# Patient Record
Sex: Female | Born: 1940 | Race: White | Hispanic: No | Marital: Married
Health system: Southern US, Academic
[De-identification: ages and names within clinical notes are randomized; demographics above are authoritative.]

## PROBLEM LIST (undated history)

## (undated) ENCOUNTER — Encounter

## (undated) ENCOUNTER — Telehealth

## (undated) ENCOUNTER — Ambulatory Visit: Payer: MEDICARE

## (undated) ENCOUNTER — Telehealth: Attending: Hematology & Oncology | Primary: Hematology & Oncology

## (undated) ENCOUNTER — Ambulatory Visit: Payer: Medicare (Managed Care)

## (undated) ENCOUNTER — Encounter: Attending: Hematology & Oncology | Primary: Hematology & Oncology

## (undated) ENCOUNTER — Encounter
Attending: Student in an Organized Health Care Education/Training Program | Primary: Student in an Organized Health Care Education/Training Program

## (undated) ENCOUNTER — Encounter: Attending: Gerontology | Primary: Gerontology

## (undated) ENCOUNTER — Encounter: Attending: Ambulatory Care | Primary: Ambulatory Care

## (undated) ENCOUNTER — Ambulatory Visit: Payer: Medicare (Managed Care) | Attending: Medical | Primary: Medical

## (undated) ENCOUNTER — Encounter: Payer: MEDICARE | Attending: Dermatology | Primary: Dermatology

## (undated) ENCOUNTER — Non-Acute Institutional Stay: Payer: MEDICARE

## (undated) ENCOUNTER — Ambulatory Visit

## (undated) ENCOUNTER — Telehealth: Attending: Ambulatory Care | Primary: Ambulatory Care

## (undated) ENCOUNTER — Ambulatory Visit: Attending: Hematology & Oncology | Primary: Hematology & Oncology

## (undated) ENCOUNTER — Encounter
Payer: MEDICARE | Attending: Student in an Organized Health Care Education/Training Program | Primary: Student in an Organized Health Care Education/Training Program

## (undated) DIAGNOSIS — I219 Acute myocardial infarction, unspecified: Secondary | ICD-10-CM

## (undated) DIAGNOSIS — I10 Essential (primary) hypertension: Secondary | ICD-10-CM

## (undated) DIAGNOSIS — M359 Systemic involvement of connective tissue, unspecified: Secondary | ICD-10-CM

## (undated) DIAGNOSIS — C801 Malignant (primary) neoplasm, unspecified: Secondary | ICD-10-CM

## (undated) HISTORY — DX: Acute myocardial infarction, unspecified: I21.9

## (undated) HISTORY — DX: Essential (primary) hypertension: I10

## (undated) HISTORY — PX: ABDOMINAL HYSTERECTOMY: SHX81

## (undated) HISTORY — PX: CARDIAC SURGERY: SHX584

## (undated) MED ORDER — AMLODIPINE 5 MG TABLET: 5 mg | tablet | Freq: Every day | 3 refills | 90 days | Status: CN

---
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
## 2009-02-11 ENCOUNTER — Ambulatory Visit: Payer: Self-pay | Admitting: Gastroenterology

## 2011-04-08 ENCOUNTER — Ambulatory Visit: Payer: Self-pay | Admitting: Family Medicine

## 2012-04-27 ENCOUNTER — Ambulatory Visit: Payer: Self-pay | Admitting: Family Medicine

## 2015-01-17 ENCOUNTER — Encounter (INDEPENDENT_AMBULATORY_CARE_PROVIDER_SITE_OTHER): Payer: Medicare Other | Admitting: Ophthalmology

## 2015-01-17 DIAGNOSIS — H35033 Hypertensive retinopathy, bilateral: Secondary | ICD-10-CM | POA: Diagnosis not present

## 2015-01-17 DIAGNOSIS — H3531 Nonexudative age-related macular degeneration: Secondary | ICD-10-CM

## 2015-01-17 DIAGNOSIS — H43813 Vitreous degeneration, bilateral: Secondary | ICD-10-CM | POA: Diagnosis not present

## 2015-01-17 DIAGNOSIS — I10 Essential (primary) hypertension: Secondary | ICD-10-CM

## 2015-01-17 DIAGNOSIS — M0609 Rheumatoid arthritis without rheumatoid factor, multiple sites: Secondary | ICD-10-CM

## 2015-07-23 ENCOUNTER — Ambulatory Visit (INDEPENDENT_AMBULATORY_CARE_PROVIDER_SITE_OTHER): Payer: Medicare Other | Admitting: Ophthalmology

## 2015-08-09 ENCOUNTER — Ambulatory Visit (INDEPENDENT_AMBULATORY_CARE_PROVIDER_SITE_OTHER): Payer: Medicare Other | Admitting: Ophthalmology

## 2015-08-09 DIAGNOSIS — H353132 Nonexudative age-related macular degeneration, bilateral, intermediate dry stage: Secondary | ICD-10-CM | POA: Diagnosis not present

## 2015-08-09 DIAGNOSIS — H35033 Hypertensive retinopathy, bilateral: Secondary | ICD-10-CM | POA: Diagnosis not present

## 2015-08-09 DIAGNOSIS — I10 Essential (primary) hypertension: Secondary | ICD-10-CM

## 2015-08-09 DIAGNOSIS — H43813 Vitreous degeneration, bilateral: Secondary | ICD-10-CM | POA: Diagnosis not present

## 2016-02-07 ENCOUNTER — Ambulatory Visit (INDEPENDENT_AMBULATORY_CARE_PROVIDER_SITE_OTHER): Payer: Medicare Other | Admitting: Ophthalmology

## 2016-02-07 DIAGNOSIS — Z79899 Other long term (current) drug therapy: Secondary | ICD-10-CM | POA: Diagnosis not present

## 2016-02-07 DIAGNOSIS — H35033 Hypertensive retinopathy, bilateral: Secondary | ICD-10-CM

## 2016-02-07 DIAGNOSIS — M069 Rheumatoid arthritis, unspecified: Secondary | ICD-10-CM

## 2016-02-07 DIAGNOSIS — H43813 Vitreous degeneration, bilateral: Secondary | ICD-10-CM

## 2016-02-07 DIAGNOSIS — I10 Essential (primary) hypertension: Secondary | ICD-10-CM

## 2016-02-07 DIAGNOSIS — H353132 Nonexudative age-related macular degeneration, bilateral, intermediate dry stage: Secondary | ICD-10-CM | POA: Diagnosis not present

## 2016-08-06 ENCOUNTER — Encounter (INDEPENDENT_AMBULATORY_CARE_PROVIDER_SITE_OTHER): Payer: Self-pay

## 2016-08-06 ENCOUNTER — Ambulatory Visit (INDEPENDENT_AMBULATORY_CARE_PROVIDER_SITE_OTHER): Payer: Medicare Other | Admitting: Vascular Surgery

## 2016-08-06 ENCOUNTER — Encounter (INDEPENDENT_AMBULATORY_CARE_PROVIDER_SITE_OTHER): Payer: Self-pay | Admitting: Vascular Surgery

## 2016-08-06 VITALS — BP 174/90 | HR 77 | Resp 16 | Ht 64.0 in | Wt 195.0 lb

## 2016-08-06 DIAGNOSIS — I10 Essential (primary) hypertension: Secondary | ICD-10-CM

## 2016-08-06 DIAGNOSIS — M7989 Other specified soft tissue disorders: Secondary | ICD-10-CM

## 2016-08-06 DIAGNOSIS — M79604 Pain in right leg: Secondary | ICD-10-CM

## 2016-08-06 DIAGNOSIS — M79605 Pain in left leg: Secondary | ICD-10-CM | POA: Diagnosis not present

## 2016-08-10 ENCOUNTER — Ambulatory Visit (INDEPENDENT_AMBULATORY_CARE_PROVIDER_SITE_OTHER): Payer: Medicare Other | Admitting: Ophthalmology

## 2016-08-10 DIAGNOSIS — H35033 Hypertensive retinopathy, bilateral: Secondary | ICD-10-CM | POA: Diagnosis not present

## 2016-08-10 DIAGNOSIS — M79605 Pain in left leg: Secondary | ICD-10-CM

## 2016-08-10 DIAGNOSIS — H43813 Vitreous degeneration, bilateral: Secondary | ICD-10-CM | POA: Diagnosis not present

## 2016-08-10 DIAGNOSIS — H353132 Nonexudative age-related macular degeneration, bilateral, intermediate dry stage: Secondary | ICD-10-CM

## 2016-08-10 DIAGNOSIS — I10 Essential (primary) hypertension: Secondary | ICD-10-CM

## 2016-08-10 DIAGNOSIS — H3554 Dystrophies primarily involving the retinal pigment epithelium: Secondary | ICD-10-CM | POA: Diagnosis not present

## 2016-08-10 DIAGNOSIS — M79604 Pain in right leg: Secondary | ICD-10-CM | POA: Insufficient documentation

## 2016-08-10 DIAGNOSIS — M7989 Other specified soft tissue disorders: Secondary | ICD-10-CM | POA: Insufficient documentation

## 2016-08-10 NOTE — Progress Notes (Signed)
Subjective:    Patient ID: Sally Lee, female    DOB: March 06, 1941, 75 y.o.   MRN: [id number] Chief Complaint  Patient presents with  . New Patient (Initial Visit)   Presents as a new patient referred by [facility], Dr. Ubaldo Glassing for bilateral lower extremity swelling and pain. Patient endorses a history of minimal amount of bilateral lower extremity swelling for "years". This exacerbation has been the "worst". States her legs are "constantly" swollen. Her diuretics have been increased without any improvement. Swelling has been present for about one month. She has worn compression stockings in the past however states her legs are "too swollen" to wear them now. She has noticed her legs have becomes painful since they have "gotten so swollen". She does not elevate her legs. Denies history of ulceration to the lower extremity.    Review of Systems  Constitutional: Negative.   HENT: Negative.   Eyes: Negative.   Respiratory: Negative.   Cardiovascular: Positive for leg swelling.  Gastrointestinal: Negative.   Endocrine: Negative.   Genitourinary: Negative.   Musculoskeletal: Negative.   Skin: Negative.   Allergic/Immunologic: Negative.   Neurological: Negative.   Hematological: Negative.   Psychiatric/Behavioral: Negative.        Objective:   Physical Exam  Constitutional: She is oriented to person, place, and time. She appears well-developed and well-nourished.  HENT:  Head: Normocephalic and atraumatic.  Right Ear: External ear normal.  Left Ear: External ear normal.  Eyes: Conjunctivae and EOM are normal. Pupils are equal, round, and reactive to light.  Neck: Normal range of motion.  Cardiovascular: Normal rate, regular rhythm, normal heart sounds and intact distal pulses.   Pulses:      Radial pulses are 2+ on the right side, and 2+ on the left side.       Dorsalis pedis pulses are 2+ on the right side, and 2+ on the left side.       Posterior tibial pulses are 2+  on the right side, and 2+ on the left side.  Pulmonary/Chest: Effort normal and breath sounds normal.  Abdominal: Soft. Bowel sounds are normal.  Musculoskeletal: Normal range of motion. She exhibits edema (Moderate 2+ Pitting Edema).  Neurological: She is alert and oriented to person, place, and time.  Skin: Skin is warm and dry.  Psychiatric: She has a normal mood and affect. Her behavior is normal. Judgment and thought content normal.   BP (!) 174/90   Pulse 77   Resp 16   Ht 5\' 4"  (1.626 m)   Wt 195 lb (88.5 kg)   BMI 33.47 kg/m   Past Medical History:  Diagnosis Date  . Hypertension   . Myocardial infarction    Social History   Social History  . Marital status: Married    Spouse name: N/A  . Number of children: N/A  . Years of education: N/A   Occupational History  . Not on file.   Social History Main Topics  . Smoking status: Former Research scientist (life sciences)  . Smokeless tobacco: Never Used  . Alcohol use No  . Drug use: No  . Sexual activity: Not on file   Other Topics Concern  . Not on file   Social History Narrative  . No narrative on file   Past Surgical History:  Procedure Laterality Date  . ABDOMINAL HYSTERECTOMY    . CARDIAC SURGERY     stent placement   Family History  Problem Relation Age of Onset  . Cancer Mother   .  Congestive Heart Failure Father   . Diabetes Maternal Grandmother    Allergies  Allergen Reactions  . Aspirin Swelling      Assessment & Plan:  Presents as a new patient referred by [facility], Dr. Ubaldo Glassing for bilateral lower extremity swelling and pain. Patient endorses a history of minimal amount of bilateral lower extremity swelling for "years". This exacerbation has been the "worst". States her legs are "constantly" swollen. Her diuretics have been increased without any improvement. Swelling has been present for about one month. She has worn compression stockings in the past however states her legs are "too swollen" to wear them now. She  has noticed her legs have becomes painful since they have "gotten so swollen". She does not elevate her legs. Denies history of ulceration to the lower extremity.   1. Swelling of lower extremity - New Will apply bilateral unna boot x four weeks to gain control of swelling. Patient will engage in appropriate elevation Will follow up in one month to assess swelling Will order venous duplex to rule our reflux vs lymphedema  2. Pain in both lower extremities - New Most likely due to swelling  Will apply bilateral unna boot x four weeks to gain control of swelling. Patient will engage in appropriate elevation Will follow up in one month to assess swelling Will order venous duplex to rule our reflux vs lymphedema  3. Essential hypertension - Stable Encouraged good control as its slows the progression of atherosclerotic disease  No current outpatient prescriptions on file prior to visit.   No current facility-administered medications on file prior to visit.     There are no Patient Instructions on file for this visit. Return in about 1 month (around 09/06/2016) for Swelling Follow Up.   Giovoni Bunch A Jet Traynham, PA-C

## 2016-08-11 ENCOUNTER — Encounter (INDEPENDENT_AMBULATORY_CARE_PROVIDER_SITE_OTHER): Payer: Medicare Other | Admitting: Vascular Surgery

## 2016-08-13 ENCOUNTER — Ambulatory Visit (INDEPENDENT_AMBULATORY_CARE_PROVIDER_SITE_OTHER): Payer: Medicare Other

## 2016-08-13 ENCOUNTER — Encounter (INDEPENDENT_AMBULATORY_CARE_PROVIDER_SITE_OTHER): Payer: Self-pay | Admitting: Vascular Surgery

## 2016-08-13 ENCOUNTER — Encounter (INDEPENDENT_AMBULATORY_CARE_PROVIDER_SITE_OTHER): Payer: Medicare Other

## 2016-08-13 ENCOUNTER — Ambulatory Visit (INDEPENDENT_AMBULATORY_CARE_PROVIDER_SITE_OTHER): Payer: Medicare Other | Admitting: Vascular Surgery

## 2016-08-13 VITALS — BP 184/98 | HR 73 | Resp 16

## 2016-08-13 DIAGNOSIS — M79605 Pain in left leg: Secondary | ICD-10-CM

## 2016-08-13 DIAGNOSIS — I872 Venous insufficiency (chronic) (peripheral): Secondary | ICD-10-CM

## 2016-08-13 DIAGNOSIS — I890 Lymphedema, not elsewhere classified: Secondary | ICD-10-CM

## 2016-08-13 DIAGNOSIS — M79604 Pain in right leg: Secondary | ICD-10-CM

## 2016-08-13 DIAGNOSIS — M7989 Other specified soft tissue disorders: Secondary | ICD-10-CM

## 2016-08-13 NOTE — Progress Notes (Signed)
MRN : [id number]  Sally Lee is a 75 y.o. (1940/12/04) female who presents with chief complaint of  Chief Complaint  Patient presents with  . Follow-up  .  History of Present Illness: The patient returns to the office for followup evaluation regarding leg swelling.  The swelling has persisted and the pain associated with swelling continues. There have not been any interval development of a ulcerations or wounds.  Since the previous visit the patient has been wearing graduated compression stockings and has noted little if any improvement in the lymphedema. The patient has been using compression routinely morning until night.  The patient also states elevation during the day and exercise is being done too.   Current Meds  Medication Sig  . cholecalciferol (VITAMIN D) 1000 units tablet Take 1,000 Units by mouth daily.  . clopidogrel (PLAVIX) 75 MG tablet Take 75 mg by mouth daily.  . hydrochlorothiazide (HYDRODIURIL) 25 MG tablet   . hydroxychloroquine (PLAQUENIL) 200 MG tablet Take by mouth.  . lansoprazole (PREVACID) 30 MG capsule Take 30 mg by mouth daily.  . Multiple Vitamins-Minerals (PRESERVISION AREDS 2 PO) Take by mouth daily.  Marland Kitchen sulfaSALAzine (AZULFIDINE) 500 MG tablet   . torsemide (DEMADEX) 20 MG tablet     Past Medical History:  Diagnosis Date  . Hypertension   . Myocardial infarction     Past Surgical History:  Procedure Laterality Date  . ABDOMINAL HYSTERECTOMY    . CARDIAC SURGERY     stent placement    Social History Social History  Substance Use Topics  . Smoking status: Former Research scientist (life sciences)  . Smokeless tobacco: Never Used  . Alcohol use No    Family History Family History  Problem Relation Age of Onset  . Cancer Mother   . Congestive Heart Failure Father   . Diabetes Maternal Grandmother   No family history of bleeding/clotting disorders, porphyria or autoimmune disease   Allergies  Allergen Reactions  . Aspirin Swelling      REVIEW OF SYSTEMS (Negative unless checked)  Constitutional: [] Weight loss  [] Fever  [] Chills Cardiac: [] Chest pain   [] Chest pressure   [] Palpitations   [] Shortness of breath when laying flat   [] Shortness of breath with exertion. Vascular:  [] Pain in legs with walking   [x] Pain in legs with standing  [] History of DVT   [] Phlebitis   [x] Swelling in legs   [] Varicose veins   [] Non-healing ulcers Pulmonary:   [] Uses home oxygen   [] Productive cough   [] Hemoptysis   [] Wheeze  [] COPD   [] Asthma Neurologic:  [] Dizziness   [] Seizures   [] History of stroke   [] History of TIA  [] Aphasia   [] Vissual changes   [] Weakness or numbness in arm   [] Weakness or numbness in leg Musculoskeletal:   [] Joint swelling   [x] Joint pain   [] Low back pain Hematologic:  [] Easy bruising  [] Easy bleeding   [] Hypercoagulable state   [] Anemic Gastrointestinal:  [] Diarrhea   [] Vomiting  [] Gastroesophageal reflux/heartburn   [] Difficulty swallowing. Genitourinary:  [] Chronic kidney disease   [] Difficult urination  [] Frequent urination   [] Blood in urine Skin:  [] Rashes   [] Ulcers  Psychological:  [] History of anxiety   []  History of major depression.  Physical Examination  Vitals:   08/13/16 1447  BP: (!) 184/98  Pulse: 73  Resp: 16   There is no height or weight on file to calculate BMI. Gen: WD/WN, NAD Head: [place]/AT, No temporalis wasting.  Ear/Nose/Throat: Hearing grossly intact, nares w/o erythema or  drainage, poor dentition Eyes: PER, EOMI, sclera nonicteric.  Neck: Supple, no masses.  No bruit or JVD.  Pulmonary:  Good air movement, clear to auscultation bilaterally, no use of accessory muscles.  Cardiac: RRR, normal S1, S2, no Murmurs. Vascular: 3+ pitting edema bilaterally with moderate skin changes, no ulcers Vessel Right Left  Radial Palpable Palpable  Ulnar Palpable Palpable  Brachial Palpable Palpable  Carotid Palpable Palpable  Femoral Palpable Palpable  Popliteal Palpable Palpable  PT  Palpable Palpable  DP Palpable Palpable   Gastrointestinal: soft, non-distended. No guarding/no peritoneal signs.  Musculoskeletal: M/S 5/5 throughout.  No deformity or atrophy.  Neurologic: CN 2-12 intact. Pain and light touch intact in extremities.  Symmetrical.  Speech is fluent. Motor exam as listed above. Psychiatric: Judgment intact, Mood & affect appropriate for pt's clinical situation. Dermatologic: No rashes or ulcers noted.  No changes consistent with cellulitis. Lymph : No Cervical lymphadenopathy, no lichenification or skin changes of chronic lymphedema.  CBC No results found for: WBC, HGB, HCT, MCV, PLT  BMET No results found for: NA, K, CL, CO2, GLUCOSE, BUN, CREATININE, CALCIUM, GFRNONAA, GFRAA CrCl cannot be calculated (No order found.).  COAG No results found for: INR, PROTIME  Radiology No results found.  Assessment/Plan 1. Lymphedema Recommend:  No surgery or intervention at this point in time.    I have reviewed my previous discussion with the patient regarding swelling and why it causes symptoms.  Patient will continue wearing graduated compression stockings class 1 (20-30 mmHg) on a daily basis. The patient will  beginning wearing the stockings first thing in the morning and removing them in the evening. The patient is instructed specifically not to sleep in the stockings.    In addition, behavioral modification including several periods of elevation of the lower extremities during the day will be continued.  This was reviewed with the patient during the initial visit.  The patient will also continue routine exercise, especially walking on a daily basis as was discussed during the initial visit.    Since graduated compression therapy and behavioral modification has not yielded adequate control of the lymphedema I believe that a lymph pump should be added to improve the control of the patient's lymphedema.  Additionally, a lymph pump is warranted because it  will reduce the risk of cellulitis and ulceration in the future.  Patient should follow-up in six months   2. Chronic venous insufficiency See #1  3. Swelling of lower extremity See #1  4. Pain in both lower extremities See #1    Hortencia Pilar, MD  08/13/2016 5:47 PM

## 2016-08-20 ENCOUNTER — Encounter (INDEPENDENT_AMBULATORY_CARE_PROVIDER_SITE_OTHER): Payer: Medicare Other

## 2016-08-27 ENCOUNTER — Encounter (INDEPENDENT_AMBULATORY_CARE_PROVIDER_SITE_OTHER): Payer: Medicare Other

## 2016-08-27 ENCOUNTER — Encounter (INDEPENDENT_AMBULATORY_CARE_PROVIDER_SITE_OTHER): Payer: Self-pay

## 2016-09-03 ENCOUNTER — Encounter (INDEPENDENT_AMBULATORY_CARE_PROVIDER_SITE_OTHER): Payer: Medicare Other

## 2016-09-03 ENCOUNTER — Ambulatory Visit (INDEPENDENT_AMBULATORY_CARE_PROVIDER_SITE_OTHER): Payer: Medicare Other | Admitting: Vascular Surgery

## 2016-10-05 ENCOUNTER — Telehealth (INDEPENDENT_AMBULATORY_CARE_PROVIDER_SITE_OTHER): Payer: Self-pay | Admitting: Vascular Surgery

## 2016-10-05 NOTE — Telephone Encounter (Signed)
Pt stated [facility] has put in an order for lymph pump, but she is doing fine and wanted to know does she still need to pursue getting the pump if she don't want to. Please call pt at [phone]

## 2017-01-19 ENCOUNTER — Emergency Department: Payer: Medicare Other

## 2017-01-19 ENCOUNTER — Emergency Department
Admission: EM | Admit: 2017-01-19 | Discharge: 2017-01-19 | Disposition: A | Discharge status: Home / Self Care | Payer: Medicare Other | Attending: Emergency Medicine | Admitting: Emergency Medicine

## 2017-01-19 ENCOUNTER — Encounter: Payer: Self-pay | Admitting: Emergency Medicine

## 2017-01-19 DIAGNOSIS — Z87891 Personal history of nicotine dependence: Secondary | ICD-10-CM | POA: Insufficient documentation

## 2017-01-19 DIAGNOSIS — R05 Cough: Secondary | ICD-10-CM | POA: Diagnosis not present

## 2017-01-19 DIAGNOSIS — Z79899 Other long term (current) drug therapy: Secondary | ICD-10-CM | POA: Diagnosis not present

## 2017-01-19 DIAGNOSIS — R142 Eructation: Secondary | ICD-10-CM | POA: Diagnosis not present

## 2017-01-19 DIAGNOSIS — R079 Chest pain, unspecified: Secondary | ICD-10-CM | POA: Diagnosis present

## 2017-01-19 DIAGNOSIS — I10 Essential (primary) hypertension: Secondary | ICD-10-CM | POA: Diagnosis not present

## 2017-01-19 DIAGNOSIS — R0781 Pleurodynia: Secondary | ICD-10-CM | POA: Diagnosis not present

## 2017-01-19 HISTORY — DX: Systemic involvement of connective tissue, unspecified: M35.9

## 2017-01-19 LAB — BASIC METABOLIC PANEL
ANION GAP: 8 (ref 5–15)
BUN: 26 mg/dL — ABNORMAL HIGH (ref 6–20)
CALCIUM: 9.2 mg/dL (ref 8.9–10.3)
CO2: 25 mmol/L (ref 22–32)
Chloride: 108 mmol/L (ref 101–111)
Creatinine, Ser: 1.02 mg/dL — ABNORMAL HIGH (ref 0.44–1.00)
GFR calc Af Amer: >60 mL/min (ref >60)
GFR, EST NON AFRICAN AMERICAN: 52 mL/min — AB (ref >60)
GLUCOSE: 106 mg/dL — AB (ref 65–99)
POTASSIUM: 4.1 mmol/L (ref 3.5–5.1)
Sodium: 141 mmol/L (ref 135–145)

## 2017-01-19 LAB — CBC
HEMATOCRIT: 44.4 % (ref 35.0–47.0)
HEMOGLOBIN: 14.8 g/dL (ref 12.0–16.0)
MCH: 30.1 pg (ref 26.0–34.0)
MCHC: 33.3 g/dL (ref 32.0–36.0)
MCV: 90.4 fL (ref 80.0–100.0)
Platelets: 214 10*3/uL (ref 150–440)
RBC: 4.91 MIL/uL (ref 3.80–5.20)
RDW: 13.8 % (ref 11.5–14.5)
WBC: 14.4 10*3/uL — ABNORMAL HIGH (ref 3.6–11.0)

## 2017-01-19 LAB — TROPONIN I

## 2017-01-19 LAB — FIBRIN DERIVATIVES D-DIMER (ARMC ONLY): Fibrin derivatives D-dimer (ARMC): 846.8 — ABNORMAL HIGH (ref 0.00–499.00)

## 2017-01-19 MED ORDER — KETOROLAC TROMETHAMINE 30 MG/ML IJ SOLN
INTRAMUSCULAR | Status: AC
  Administered 2017-01-19: 15 mg via INTRAVENOUS
  Filled 2017-01-19: qty 1

## 2017-01-19 MED ORDER — PREDNISONE 10 MG (21) PO TBPK: ORAL_TABLET | Freq: Every day | ORAL | 0 refills | Status: DC

## 2017-01-19 MED ORDER — TRAMADOL HCL 50 MG PO TABS: 50.0000 mg | ORAL_TABLET | Freq: Four times a day (QID) | ORAL | 0 refills | Status: AC | PRN

## 2017-01-19 MED ORDER — IOPAMIDOL (ISOVUE-370) INJECTION 76%
75.0000 mL | Freq: Once | INTRAVENOUS | Status: AC | PRN
  Administered 2017-01-19: 75 mL via INTRAVENOUS

## 2017-01-19 MED ORDER — KETOROLAC TROMETHAMINE 30 MG/ML IJ SOLN
15.0000 mg | Freq: Once | INTRAMUSCULAR | Status: AC
  Administered 2017-01-19: 15 mg via INTRAVENOUS

## 2017-01-19 NOTE — ED Provider Notes (Signed)
[facility] Emergency Department Provider Note       Time seen: ----------------------------------------- 7:03 AM on 01/19/2017 -----------------------------------------     I have reviewed the triage vital signs and the nursing notes.   HISTORY   Chief Complaint Chest Pain    HPI Sally Lee is a 76 y.o. female who presents to the ED for mid chest pain that is nonradiating. Pain is been accompanied by burping since awakening at 2 AM. Patient states she has history of same with a diagnosis of pericarditis. Current pain is 6 out of 10, and she also had left-sided chest pain that hurts when she bruits. Patient reports she was at [facility] and has finished colchicine for treatments of pericarditis. Pain is not as severe as it was then. She denies fevers, chills or other complaints. She has had a mild cough.   Past Medical History:  Diagnosis Date  . Hypertension   . Myocardial infarction [facility])     Patient Active Problem List   Diagnosis Date Noted  . Lymphedema 08/13/2016  . Chronic venous insufficiency 08/13/2016  . Swelling of lower extremity 08/10/2016  . Pain in both lower extremities 08/10/2016  . Essential hypertension 08/10/2016    Past Surgical History:  Procedure Laterality Date  . ABDOMINAL HYSTERECTOMY    . CARDIAC SURGERY     stent placement    Allergies Aspirin  Social History Social History  Substance Use Topics  . Smoking status: Former Research scientist (life sciences)  . Smokeless tobacco: Never Used  . Alcohol use No    Review of Systems Constitutional: Negative for fever. Eyes: Negative for vision changes ENT:  Negative for congestion, sore throat Cardiovascular: Positive for chest pain Respiratory: Positive for pain with breathing, cough Gastrointestinal: Negative for abdominal pain, vomiting and diarrhea. Positive for belching Genitourinary: Negative for dysuria. Musculoskeletal: Negative for back pain. Skin: Negative for  rash. Neurological: Negative for headaches, focal weakness or numbness.  All systems negative/normal/unremarkable except as stated in the HPI  ____________________________________________   PHYSICAL EXAM:  VITAL SIGNS: ED Triage Vitals  Enc Vitals Group     BP 01/19/17 0354 (!) 153/98     Pulse Rate 01/19/17 0354 86     Resp 01/19/17 0354 18     Temp 01/19/17 0354 98 F (36.7 C)     Temp Source 01/19/17 0354 Oral     SpO2 01/19/17 0354 100 %     Weight 01/19/17 0352 178 lb (80.7 kg)     Height 01/19/17 0352 5\' 4"  (1.626 m)     Head Circumference --      Peak Flow --      Pain Score 01/19/17 0352 6     Pain Loc --      Pain Edu? --      Excl. in [place]? --     Constitutional: Alert and oriented. Well appearing and in no distress. Eyes: Conjunctivae are normal. PERRL. Normal extraocular movements. ENT   Head: Normocephalic and atraumatic.   Nose: No congestion/rhinnorhea.   Mouth/Throat: Mucous membranes are moist.   Neck: No stridor. Cardiovascular: Normal rate, regular rhythm. No murmurs, rubs, or gallops. Respiratory: Normal respiratory effort without tachypnea nor retractions. Breath sounds are clear and equal bilaterally. No wheezes/rales/rhonchi. Gastrointestinal: Soft and nontender. Normal bowel sounds Musculoskeletal: Nontender with normal range of motion in extremities. No lower extremity tenderness nor edema. Neurologic:  Normal speech and language. No gross focal neurologic deficits are appreciated.  Skin:  Skin is warm, dry and  intact. No rash noted. Psychiatric: Mood and affect are normal. Speech and behavior are normal.  ____________________________________________  EKG: Interpreted by me. Sinus rhythm rate 88 bpm, normal PR interval, normal QT. LVH  ____________________________________________  ED COURSE:  Pertinent labs & imaging results that were available during my care of the patient were reviewed by me and considered in my medical decision  making (see chart for details). Patient presents for chest pain, we will assess with labs and imaging as indicated.   Procedures ____________________________________________   LABS (pertinent positives/negatives)  Labs Reviewed  BASIC METABOLIC PANEL - Abnormal; Notable for the following:       Result Value   Glucose, Bld 106 (*)    BUN 26 (*)    Creatinine, Ser 1.02 (*)    GFR calc non Af Amer 52 (*)    All other components within normal limits  CBC - Abnormal; Notable for the following:    WBC 14.4 (*)    All other components within normal limits  FIBRIN DERIVATIVES D-DIMER ([facility] ONLY) - Abnormal; Notable for the following:    Fibrin derivatives D-dimer ([facility]) 846.80 (*)    All other components within normal limits  TROPONIN I  TROPONIN I    RADIOLOGY Images were viewed by me CXR IMPRESSION: Cardiac enlargement. Fluid or thickened pleura in the left costophrenic angle. Linear atelectasis or fibrosis in both lower lungs and right mid lung. IMPRESSION: No evidence pulmonary embolism.  No evidence of acute cardiopulmonary disease.  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 ____________________________________________  FINAL ASSESSMENT AND PLAN  Chest pain, Pleurisy  Plan: Patient's labs and imaging were dictated above. Patient had presented for Chest pain, x-rays revealed fluid in the left costophrenic angle but this was not evidence on CT angiogram. This is likely pleurisy possibly from rheumatoid arthritis. We will try a short course of steroids but I do not feel antibiotics would be beneficial. She is stable for outpatient follow-up.   Earleen Newport, MD   Note: This note was generated in part or whole with voice recognition software. Voice recognition is usually quite accurate but there are transcription errors that can and very often do occur. I apologize for any typographical errors that were not detected and corrected.      Earleen Newport, MD 01/19/17 [phone]

## 2017-01-19 NOTE — ED Triage Notes (Signed)
Pt to triage via w/c with no distress noted; pt c/o mid CP, nonradiating accomp by burping since awakening at 2am; st hx of same with dx pericarditis

## 2017-02-09 ENCOUNTER — Ambulatory Visit (INDEPENDENT_AMBULATORY_CARE_PROVIDER_SITE_OTHER): Payer: Medicare Other | Admitting: Ophthalmology

## 2017-02-23 ENCOUNTER — Ambulatory Visit (INDEPENDENT_AMBULATORY_CARE_PROVIDER_SITE_OTHER): Payer: Medicare Other | Admitting: Ophthalmology

## 2017-02-23 DIAGNOSIS — I10 Essential (primary) hypertension: Secondary | ICD-10-CM

## 2017-02-23 DIAGNOSIS — Z79899 Other long term (current) drug therapy: Secondary | ICD-10-CM | POA: Diagnosis not present

## 2017-02-23 DIAGNOSIS — H353132 Nonexudative age-related macular degeneration, bilateral, intermediate dry stage: Secondary | ICD-10-CM

## 2017-02-23 DIAGNOSIS — H35033 Hypertensive retinopathy, bilateral: Secondary | ICD-10-CM

## 2017-02-23 DIAGNOSIS — H43813 Vitreous degeneration, bilateral: Secondary | ICD-10-CM

## 2017-02-23 DIAGNOSIS — M069 Rheumatoid arthritis, unspecified: Secondary | ICD-10-CM | POA: Diagnosis not present

## 2017-02-25 ENCOUNTER — Ambulatory Visit (INDEPENDENT_AMBULATORY_CARE_PROVIDER_SITE_OTHER): Payer: Medicare Other | Admitting: Vascular Surgery

## 2017-04-06 ENCOUNTER — Encounter (INDEPENDENT_AMBULATORY_CARE_PROVIDER_SITE_OTHER): Payer: Medicare Other | Admitting: Ophthalmology

## 2017-04-06 DIAGNOSIS — H35033 Hypertensive retinopathy, bilateral: Secondary | ICD-10-CM | POA: Diagnosis not present

## 2017-04-06 DIAGNOSIS — I10 Essential (primary) hypertension: Secondary | ICD-10-CM | POA: Diagnosis not present

## 2017-04-06 DIAGNOSIS — H43813 Vitreous degeneration, bilateral: Secondary | ICD-10-CM

## 2017-04-06 DIAGNOSIS — H353132 Nonexudative age-related macular degeneration, bilateral, intermediate dry stage: Secondary | ICD-10-CM | POA: Diagnosis not present

## 2017-05-04 MED ORDER — PREDNISONE 5 MG TABLET
ORAL_TABLET | 0 refills | 0 days | Status: CP
Start: 2017-05-04 — End: 2017-06-10

## 2017-06-03 ENCOUNTER — Ambulatory Visit
Admission: RE | Admit: 2017-06-03 | Discharge: 2017-06-03 | Disposition: A | Discharge status: Home / Self Care | Payer: MEDICARE

## 2017-06-03 DIAGNOSIS — M0600 Rheumatoid arthritis without rheumatoid factor, unspecified site: Principal | ICD-10-CM

## 2017-06-09 ENCOUNTER — Ambulatory Visit
Admission: RE | Admit: 2017-06-09 | Discharge: 2017-07-08 | Payer: MEDICARE | Attending: Rehabilitative and Restorative Service Providers" | Admitting: Rehabilitative and Restorative Service Providers"

## 2017-06-09 DIAGNOSIS — M542 Cervicalgia: Secondary | ICD-10-CM

## 2017-06-10 ENCOUNTER — Ambulatory Visit
Admission: RE | Admit: 2017-06-10 | Discharge: 2017-06-10 | Disposition: A | Discharge status: Home / Self Care | Payer: MEDICARE

## 2017-06-10 DIAGNOSIS — M0600 Rheumatoid arthritis without rheumatoid factor, unspecified site: Principal | ICD-10-CM

## 2017-06-10 DIAGNOSIS — M542 Cervicalgia: Secondary | ICD-10-CM

## 2017-08-27 ENCOUNTER — Ambulatory Visit: Admission: RE | Admit: 2017-08-27 | Discharge: 2017-08-27 | Disposition: A | Discharge status: Home / Self Care

## 2017-08-27 DIAGNOSIS — Z1239 Encounter for other screening for malignant neoplasm of breast: Principal | ICD-10-CM

## 2017-10-07 ENCOUNTER — Encounter (INDEPENDENT_AMBULATORY_CARE_PROVIDER_SITE_OTHER): Payer: Medicare Other | Admitting: Ophthalmology

## 2017-10-07 DIAGNOSIS — H43813 Vitreous degeneration, bilateral: Secondary | ICD-10-CM | POA: Diagnosis not present

## 2017-10-07 DIAGNOSIS — H353132 Nonexudative age-related macular degeneration, bilateral, intermediate dry stage: Secondary | ICD-10-CM

## 2017-10-07 DIAGNOSIS — I10 Essential (primary) hypertension: Secondary | ICD-10-CM | POA: Diagnosis not present

## 2017-10-07 DIAGNOSIS — H35033 Hypertensive retinopathy, bilateral: Secondary | ICD-10-CM

## 2017-10-18 ENCOUNTER — Encounter
Admit: 2017-10-18 | Discharge: 2017-10-19 | Payer: MEDICARE | Attending: Hematology & Oncology | Primary: Hematology & Oncology

## 2017-10-18 DIAGNOSIS — D472 Monoclonal gammopathy: Principal | ICD-10-CM

## 2017-10-19 ENCOUNTER — Encounter: Admit: 2017-10-19 | Discharge: 2017-10-20 | Payer: MEDICARE

## 2017-10-20 ENCOUNTER — Encounter: Admit: 2017-10-20 | Discharge: 2017-10-21 | Payer: MEDICARE

## 2017-11-04 ENCOUNTER — Encounter: Admit: 2017-11-04 | Discharge: 2017-11-05 | Payer: MEDICARE

## 2017-11-04 DIAGNOSIS — M0600 Rheumatoid arthritis without rheumatoid factor, unspecified site: Principal | ICD-10-CM

## 2017-11-04 MED ORDER — PREDNISONE 5 MG TABLET: tablet | 1 refills | 0 days | Status: AC

## 2017-11-04 MED ORDER — PREDNISONE 5 MG TABLET
ORAL_TABLET | ORAL | 1 refills | 0.00000 days | Status: CP
Start: 2017-11-04 — End: 2018-03-17

## 2017-11-04 MED ORDER — TOFACITINIB 5 MG TABLET: 5 mg | tablet | Freq: Two times a day (BID) | 11 refills | 0 days | Status: AC

## 2017-11-04 MED ORDER — TOFACITINIB 5 MG TABLET
ORAL_TABLET | Freq: Two times a day (BID) | ORAL | 11 refills | 0.00000 days | Status: CP
Start: 2017-11-04 — End: 2017-11-04

## 2017-11-04 NOTE — Unmapped (Addendum)
Start prednisone taper:  Take prednisone 20 mg (Four 5 mg tablets) daily for three days, then  Take prednisone 15 mg (Three 5 mg tablets) daily for three days, then  Take prednisone 10 mg (Two 5 mg tablets) daily for three days, then  Take prednisone 5 mg (One 5 mg tablet) daily for three days, then OFF.     Plan to start Xeljanz (Tofacitinib) 5 mg twice a day.     Continue on Hydroxychloroquine (Plaquenil) 200 mg daily.     tofacitinib  Pronunciation:  TOE fa SYE ti nib  Brand:  Shelly Cooper  What is the most important information I should know about tofacitinib?  You should not use tofacitinib if you have a serious infection. Before you start treatment, your doctor may perform tests to make sure you do not have an infection.  Tofacitinib affects your immune system. You may get infections more easily, even serious or fatal infections. Call your doctor if you have a fever, chills, aches, tiredness, cough, skin sores, diarrhea, weight loss, or burning when you urinate.  What is tofacitinib?  Tofacitinib blocks the activity of certain enzymes in the body that affect immune system function.  Tofacitinib is used to treat moderate to severe rheumatoid arthritis or active psoriatic arthritis in adults who have tried methotrexate other medications without successful treatment of symptoms. Tofacitinib is sometimes given in combination with methotrexate or other arthritis medicines.  Tofacitinib is also used to treat adults with moderate to severe ulcerative colitis.  Tofacitinib may also be used for purposes not listed in this medication guide.  What should I discuss with my healthcare provider before taking tofacitinib?  You should not use tofacitinib if you are allergic to it, or if you have any kind of infection.  Tell your doctor if you have ever had:  ?? liver disease (especially hepatitis B or C);  ?? a chronic infection;  ?? any type of cancer;  ?? a stomach or intestinal problem such as diverticulitis or an ulcer;  ?? kidney disease;  ?? diabetes; or  ?? if you are scheduled to receive any vaccine.  Tell your doctor if you have ever had tuberculosis, if anyone in your household has tuberculosis, or if you have recently traveled to an area where tuberculosis is common [facility], [place], and the [place]).  Using tofacitinib may increase your risk of developing certain cancers, such as lymphoma or skin cancer. Ask your doctor about this risk.  It is not known whether this medicine will harm an unborn baby. Tell your doctor if you are pregnant or plan to become pregnant. Tofacitinib may affect your ability to have children during treatment and in the future.  If you are pregnant, your name may be listed on a pregnancy registry to track the effects of tofacitinib on the baby.  It is not safe to breast-feed a baby while you are using this medicine.  Also do not breast-feed for at least 18 hours after your last dose (36 hours if you take extended-release tablets). If you use a breast pump during this time, do not feed the milk to your baby.  How should I take tofacitinib?  Before you start treatment with tofacitinib, your doctor may perform tests to make sure you do not have tuberculosis or other infections.  Follow all directions on your prescription label and read all medication guides or instruction sheets. Use the medicine exactly as directed.  You may take tofacitinib with or  without food.  Do not crush, chew, or break an extended-release tablet. Swallow it whole.  Tofacitinib affects your immune system. You may get infections more easily, even serious or fatal infections.  If you've ever had shingles (herpes zoster), using tofacitinib can cause this virus to become active or get worse.  Your doctor will need to examine you on a regular basis.  Store in the original container at room temperature away from moisture and heat.  Some tablets are made with a shell that is not absorbed or melted in the body. Part of this shell may appear in your stool. This is normal and will not make the medicine less effective.  What happens if I miss a dose?  Take the medicine as soon as you can, but skip the missed dose if it is almost time for your next dose. Do not take two doses at one time.  What happens if I overdose?  Seek emergency medical attention or call the Poison Help line at [phone].  What should I avoid while taking tofacitinib?  Do not receive a live vaccine while using tofacitinib, or you could develop a serious infection. Live vaccines include measles, mumps, rubella (MMR), polio, rotavirus, typhoid, yellow fever, varicella (chickenpox), and zoster (shingles).  What are the possible side effects of tofacitinib?  Get emergency medical help if you have signs of an allergic reaction: hives; difficult breathing; swelling of your face, lips, tongue, or throat.  You may get infections more easily, even serious or fatal infections. Call your doctor right away if you have signs of infection such as:  ?? fever, chills, sweating;  ?? skin sores;  ?? tiredness, muscle pain;  ?? increased urination, pain or burning when you urinate;  ?? stomach pain, diarrhea, weight loss; or  ?? cough, shortness of breath, coughing up pink or red mucus.  Further doses may be delayed until your infection clears up.  Also call your doctor at once if you have:  ?? low red blood cells (anemia) --pale skin, unusual tiredness, feeling light-headed or short of breath, cold hands and feet;  ?? signs of hepatitis --loss of appetite, vomiting, stomach pain (upper right side), fever, tiredness, dark urine, clay-colored stools, jaundice (yellowing of the skin or eyes);  ?? shingles --burning pain, numbness, tingling, itching, skin rash or blisters;  ?? signs of perforation (a hole or tear) in your stomach or intestines --fever, ongoing stomach pain, change in bowel habits; or  ?? signs of tuberculosis: fever, cough, night sweats, loss of appetite, weight loss, and feeling very tired.  Common side effects may include:  ?? skin rash, shingles;  ?? increased blood pressure;  ?? abnormal blood tests;  ?? headache;  ?? diarrhea; or  ?? cold symptoms such as stuffy nose, sneezing, sore throat.  This is not a complete list of side effects and others may occur. Call your doctor for medical advice about side effects. You may report side effects to FDA at 1-800-FDA-1088.  What other drugs will affect tofacitinib?  Sometimes it is not safe to use certain medications at the same time. Some drugs can affect your blood levels of other drugs you take, which may increase side effects or make the medications less effective.  Tell your doctor about all your current medicines. Many drugs can affect tofacitinib, especially:  ?? azathioprine;  ?? cyclosporine;  ?? tuberculosis medicine; or  ?? other drugs to arthritis or ulcerative colitis --abatacept, adalimumab, anakinra, certolizumab, etanercept, golimumab, infliximab, rituximab, secukinumab, tocilizumab, ustekinumab,  vedolizumab.  This list is not complete and many other drugs may affect tofacitinib. This includes prescription and over-the-counter medicines, vitamins, and herbal products. Not all possible drug interactions are listed here.  Where can I get more information?  Your pharmacist can provide more information about tofacitinib.    Remember, keep this and all other medicines out of the reach of children, never share your medicines with others, and use this medication only for the indication prescribed.  Every effort has been made to ensure that the information provided by [employer] is accurate, up-to-date, and complete, but no guarantee is made to that effect. Drug information contained herein may be time sensitive. [employer] information has been compiled for use by healthcare practitioners and consumers in the [place] and therefore [employer] does not warrant that uses outside of the [place] are appropriate, unless specifically indicated otherwise. [employer]'s drug information does not endorse drugs, diagnose patients or recommend therapy. [employer]'s drug information is an [employer] corporate to assist licensed healthcare practitioners in caring for their patients and/or to serve consumers viewing this service as a supplement to, and not a substitute for, the expertise, skill, knowledge and judgment of healthcare practitioners. The absence of a warning for a given drug or drug combination in no way should be construed to indicate that the drug or drug combination is safe, effective or appropriate for any given patient. [employer] does not assume any responsibility for any aspect of healthcare administered with the aid of information [employer] provides. The information contained herein is not intended to cover all possible uses, directions, precautions, warnings, drug interactions, allergic reactions, or adverse effects. If you have questions about the drugs you are taking, check with your doctor, nurse or pharmacist.  Copyright [phone])[phone] [employer] Version: 3.01. Revision date: 05/13/2017.  Care instructions adapted under license by [facility]. If you have questions about a medical condition or this instruction, always ask your healthcare professional. [employer] disclaims any warranty or liability for your use of this information.

## 2017-11-04 NOTE — Unmapped (Signed)
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  Pt also has a history of non-melanoma skin cancer but was cleared by her dermatologist to start Enbrel, which was started in November 2016 but discontinued in May 2017 due to worsening squamous cell skin lesions. She was continued hydroxychloroquine and started on Sulfasalazine in May 2017 but we have not been able to titrate dose beyond 500 mg po bid due to cough and concerns of possible side effects. She was last seen by myself in November 2017 following a recent shingles flare with evidence for increased LE edema so no medication adjustments made at that time. She called in December 2017 stating that the sulfasalazine was causing swelling in the face and so advised her to stop. She reported 22lb weight loss after stopping the sulfasalazine. She was then seen by Carlus Pavlov PA-C in February 2018 and had recently been diagnosed with pericarditis and started on colchicine. 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 Patient last seen by myself in June 2018 and reported tapering off colchicine and medrol dose pack for pleuritis and pericarditis. She reported compliance with hydroxychloroquine 200 mg qd. We had discussed starting Actemra/Tocilizumab for RA with pericarditis and pleuritis and patient finally agreed to start following cardiology approval in October 2018. Due to $500 copay cost following first infusion, patient declined continuing with tocilizumab infusions. She was not interested in pursuing Okarche injections with tocilizumab or other therapy. She returns today for follow-up.     Today, patient reports that she did not notice any benefit with the one time infusion of tocilizumab. She continues on hydroxychloroquine 200 mg po qd. She reports that her joints have not been great for the past two months particularly at night. She reports pain and swelling in her hands. She complains of pain in wrists and shoulders. She had a mechanical fall in November that affected both knees. She reports no significant LE edema. She reports seeing ophthalmologist recently who did not want her to take more than hydroxychloroquine 200 mg daily due to underlying macular degeneration. 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 She has been having more skin cancer lesion treatments for SCC and Basal CC.     ROS??:Attests to the above otherwise review of all other system is negative.??  ??  Past Medical and Surgical History:  ??  Patient Active Problem List    Diagnosis Date Noted   ??? Chest pain 09/15/2016   ??? Seronegative rheumatoid arthritis (CMS-HCC) 03/14/2015   ??? De Quervain's tenosynovitis, bilateral 07/17/2014   ??? Nonspecific immunological findings 03/14/2014   ??? Edema 01/03/2014   ??? Primary localized osteoarthrosis, lower leg 07/01/2012   ??? Derangement of medial meniscus 06/23/2012   ??? Other specified cardiac dysrhythmias 03/17/2012   ??? Coronary atherosclerosis (RAF-HCC) 01/29/2012   ??? Essential hypertension 01/29/2012   ??? 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 HYSTERECTOMY  1978    fibroids   ??? NASAL POLYP EXCISION     ???  STENTS [facility] HISTORICAL RESULT)  2001    post-MI   ??? TRIGGER FINGER RELEASE       Allergies:   ??  Allergies   Allergen Reactions   ??? Aspirin Swelling     Other reaction(s): Swelling  Facial swelling  Other reaction(s): UNKNOWN   ??? Lyrica  [Pregabalin] Swelling     Other reaction(s): Swelling  Other reaction(s): SWELLING Current Outpatient Medications:  ??  Current Outpatient Prescriptions:   ???  cetirizine (ZYRTEC) 10 MG tablet, Take 10 mg by mouth daily as needed., Disp: , Rfl:   ???  clopidogrel (PLAVIX) 75 mg tablet, Take 75 mg by mouth daily., Disp: , Rfl:   ???  hydroxychloroquine (PLAQUENIL) 200 mg tablet, Take 1 tablet (200 mg total) by mouth daily., Disp: 90 tablet, Rfl: 3  ???  lansoprazole (PREVACID) 30 MG capsule, Take 1 capsule by mouth once daily., Disp: , Rfl:   ???  losartan (COZAAR) 100 MG tablet, Take 100 mg by mouth., Disp: , Rfl:   ???  torsemide (DEMADEX) 20 MG tablet, Take 20 mg by mouth daily as needed. , Disp: , Rfl:   ???  VIT C/VIT E AC/LUT/COPPER/ZINC (PRESERVISION LUTEIN ORAL), Take 1 tablet by mouth daily., Disp: , Rfl:       Immunization History   Administered Date(s) Administered   ??? INFLUENZA TIV (TRI) 47MO+ W/ PRESERV (IM) 06/14/2014   ??? INFLUENZA TIV (TRI) PF (IM) 06/15/2007, 06/05/2008, 06/10/2010, 06/30/2011   ??? Influenza Vaccine Quad (IIV4 PF) 44mo+ injectable 05/17/2015, 06/10/2017   ??? Influenza Virus Vaccine, unspecified formulation 06/24/2014, 06/02/2016   ??? PNEUMOCOCCAL POLYSACCHARIDE 23 10/06/2016   ??? Pneumococcal Conjugate 13-Valent 04/03/2014     ????  PHYSICAL EXAM??  Vital signs: BP 169/74 (BP Site: L Arm, BP Position: Sitting)  - Temp 36.2 ??C (97.2 ??F) (Oral)  - Resp 20  - Wt 86 kg (189 lb 9.6 oz)  - BMI 32.54 kg/m?? Body mass index is 32.54 kg/m??.  Gen: Well-developed, well-nourished adult in no apparent distress.  Pleasant and cooperative. AOx4.?  HEENT: PERRLA, EOMI, MMM, oropharynx in pink without ulcerations or exudates visualized.??    Chest: Broad chest excursion with good air movement. CTAB without wheezing/rhonchi/rales. ????  CV: RRR, Normal S1/S2, No murmurs/rubs/gallops heard.   Extremities: Warm, 2+ radial and pedal pulses, no C/C/E.????  Neuro: Good comprehension/cognition. CN 2-12 intact.  Gait intact  Comprehensive Musculoskeletal Examination:????  ?? Jaw, neck without limited ROM.????  ?? Shoulders, elbows, wrists, hands, fingers:  Squaring at [facility] bilaterally.  Mild swelling across MCPs of the right hand with mild tenderness and reduced grip. Right wrist swollen and tender with limited ROM. Right shoulder with pain with internal rotation and adduction.  Mild swelling in the left hand at the MCPs. Left wrist, elbows and shoulders wnl.   ?? Right hip slightly limited external rotation. Left hip wnl.   ?? Knee, ankles, feet, toes: No deformity, erythema, limited ROM. No MTP tenderness.  Skin: Scattered and extensive erythematous scaly lesions on bilateral lower extremities, likely related to the squamous cell. Traced edema    Results: had labs checked in December 2018 at [facility] including CBC w/diff, Cr and AST/ALT that were normal through Care everywhere and reviewed.   Recent Results (from the past 2016 hour(s))   Immunoglobulin Free LT Chains Blood    Collection Time: 10/18/17  9:23 AM   Result Value Ref Range    Kappa Free, Serum 2.04 (H) 0.33 - 1.94 mg/dL  Lambda Free, Serum 1.87 0.57 - 2.63 mg/dL    K/L FLC Ratio 1.61 0.96 - 1.65   Basic Metabolic Panel    Collection Time: 10/18/17  9:23 AM   Result Value Ref Range    Sodium 141 135 - 145 mmol/L    Potassium 4.5 3.5 - 5.0 mmol/L    Chloride 108 (H) 98 - 107 mmol/L    CO2 27.0 22.0 - 30.0 mmol/L    BUN 21 7 - 21 mg/dL    Creatinine 0.45 (H) 0.60 - 1.00 mg/dL    BUN/Creatinine Ratio 21     EGFR MDRD Non Af Amer 53 (L) >=60 mL/min/1.35m2    EGFR MDRD Af Amer >=60 >=60 mL/min/1.9m2    Anion Gap 6 (L) 9 - 15 mmol/L    Glucose 121 65 - 179 mg/dL    Calcium 9.9 8.5 - 40.9 mg/dL   CBC w/ Differential    Collection Time: 10/18/17  9:23 AM   Result Value Ref Range    WBC 6.4 4.5 - 11.0 10*9/L    RBC 4.71 4.00 - 5.20 10*12/L    HGB 14.1 13.5 - 16.0 g/dL    HCT 81.1 91.4 - 78.2 %    MCV 93.5 80.0 - 100.0 fL    MCH 29.9 26.0 - 34.0 pg    MCHC 32.0 31.0 - 37.0 g/dL    RDW 95.6 21.3 - 08.6 %    MPV 7.1 7.0 - 10.0 fL    Platelet 360 150 - 440 10*9/L    Neutrophils % 69.6 %    Lymphocytes % 17.2 %    Monocytes % 4.0 %    Eosinophils % 6.9 %    Basophils % 1.0 %    Absolute Neutrophils 4.4 2.0 - 7.5 10*9/L    Absolute Lymphocytes 1.1 (L) 1.5 - 5.0 10*9/L    Absolute Monocytes 0.3 0.2 - 0.8 10*9/L    Absolute Eosinophils 0.4 0.0 - 0.4 10*9/L    Absolute Basophils 0.1 0.0 - 0.1 10*9/L    Large Unstained Cells 1 0 - 4 %   MYELOMA WORKUP, SERUM    Collection Time: 10/18/17  9:23 AM   Result Value Ref Range    ALBUMIN (SPE) 3.5 3.5 - 5.0 g/dL    ALPHA-1 GLOBULIN 0.3 0.2 - 0.5 g/dL    ALPHA-2 GLOBULIN 0.7 0.5 - 1.1 g/dL    BETA-1 GLOBULIN 0.4 0.3 - 0.6 g/dL    BETA-2 GLOBULIN 0.3 0.2 - 0.6 g/dL    GAMMAGLOBULIN 0.6 0.5 - 1.5 g/dL    SPE Interpretation      Immunofixation Electrophoresis, Serum      Total Protein 5.9 (L) 6.5 - 8.3 g/dL   Myeloma Workup Chemistries    Collection Time: 10/18/17  9:23 AM   Result Value Ref Range    Total Protein 5.9 (L) 6.5 - 8.3 g/dL    Total IgG 578 469-6,295 mg/dL    IgM 45 35 - 284 mg/dL    IgA 132.4 40.1 - 027.2 mg/dL       ??GENERAL SUMMARY/IMPRESSION AND RECOMMENDATIONS: ??  ???In summary, the patient is a 77 y.o. female with seronegative RA and non-melanoma skin cancer s/p pericarditis and pleuritis in 2018 now on hydroxychloroquine 200 mg qd monotherapy.  Cannot take MTX and Leflunomide due to concerns for ILD. With sulfasalazine, facial swelling. Enbrel with worsening skin cancer.  Actemra infusions were too costly and patient reluctant to try self-injectable verson.  Ophthalmologist does not wish  for patient to try higher dose of hydroxychloroquine to reach 5 mg/kg dosing level due to underlying macular degeneration.  Unfortunately, patient does have active RA by clinical exam and would benefit from escalation therapy. Discussed biologics again but she is resistant due to cost and delivery system. We discussed tofacitinib Harriette Ohara) and she is willing to try as oral medication.  Reviewed risks and benefits of medication in detail. Has CVD and CVA risks. Carries malignancy risk as well. Will send medication to [facility] for benefit/prior authorization.  While awaiting tofacitinib approval will start prednisone taper. She wanted short taper though I think she would benefit from a longer taper more.   Follow-up in three months with Carlus Pavlov [facility] and 6/9 months with myself, Dr. Scarlette Calico.     1. Seronegative rheumatoid arthritis (CMS-HCC)  tofacitinib (XELJANZ) 5 mg Tab tablet    predniSONE (DELTASONE) 5 MG tablet    DISCONTINUED: predniSONE (DELTASONE) 5 MG tablet     Patient Instructions   Start prednisone taper:  Take prednisone 20 mg (Four 5 mg tablets) daily for three days, then  Take prednisone 15 mg (Three 5 mg tablets) daily for three days, then  Take prednisone 10 mg (Two 5 mg tablets) daily for three days, then  Take prednisone 5 mg (One 5 mg tablet) daily for three days, then OFF.     Plan to start Xeljanz (Tofacitinib) 5 mg twice a day.     Continue on Hydroxychloroquine (Plaquenil) 200 mg daily.     tofacitinib  Pronunciation:  TOE fa SYE ti nib  Brand:  Ephriam Knuckles XR  What is the most important information I should know about tofacitinib?  You should not use tofacitinib if you have a serious infection. Before you start treatment, your doctor may perform tests to make sure you do not have an infection.  Tofacitinib affects your immune system. You may get infections more easily, even serious or fatal infections. Call your doctor if you have a fever, chills, aches, tiredness, cough, skin sores, diarrhea, weight loss, or burning when you urinate.  What is tofacitinib?  Tofacitinib blocks the activity of certain enzymes in the body that affect immune system function.  Tofacitinib is used to treat moderate to severe rheumatoid arthritis or active psoriatic arthritis in adults who have tried methotrexate other medications without successful treatment of symptoms. Tofacitinib is sometimes given in combination with methotrexate or other arthritis medicines.  Tofacitinib is also used to treat adults with moderate to severe ulcerative colitis.  Tofacitinib may also be used for purposes not listed in this medication guide.  What should I discuss with my healthcare provider before taking tofacitinib?  You should not use tofacitinib if you are allergic to it, or if you have any kind of infection.  Tell your doctor if you have ever had:  ?? liver disease (especially hepatitis B or C);  ?? a chronic infection;  ?? any type of cancer;  ?? a stomach or intestinal problem such as diverticulitis or an ulcer;  ?? kidney disease;  ?? diabetes; or  ?? if you are scheduled to receive any vaccine.  Tell your doctor if you have ever had tuberculosis, if anyone in your household has tuberculosis, or if you have recently traveled to an area where tuberculosis is common [facility], [place], and the [place]).  Using tofacitinib may increase your risk of developing certain cancers, such as lymphoma or skin cancer. Ask your doctor about this risk.  It  is not known whether this medicine will harm an unborn baby. Tell your doctor if you are pregnant or plan to become pregnant. Tofacitinib may affect your ability to have children during treatment and in the future.  If you are pregnant, your name may be listed on a pregnancy registry to track the effects of tofacitinib on the baby.  It is not safe to breast-feed a baby while you are using this medicine.  Also do not breast-feed for at least 18 hours after your last dose (36 hours if you take extended-release tablets). If you use a breast pump during this time, do not feed the milk to your baby.  How should I take tofacitinib?  Before you start treatment with tofacitinib, your doctor may perform tests to make sure you do not have tuberculosis or other infections.  Follow all directions on your prescription label and read all medication guides or instruction sheets. Use the medicine exactly as directed. You may take tofacitinib with or without food.  Do not crush, chew, or break an extended-release tablet. Swallow it whole.  Tofacitinib affects your immune system. You may get infections more easily, even serious or fatal infections.  If you've ever had shingles (herpes zoster), using tofacitinib can cause this virus to become active or get worse.  Your doctor will need to examine you on a regular basis.  Store in the original container at room temperature away from moisture and heat.  Some tablets are made with a shell that is not absorbed or melted in the body. Part of this shell may appear in your stool. This is normal and will not make the medicine less effective.  What happens if I miss a dose?  Take the medicine as soon as you can, but skip the missed dose if it is almost time for your next dose. Do not take two doses at one time.  What happens if I overdose?  Seek emergency medical attention or call the Poison Help line at [phone].  What should I avoid while taking tofacitinib?  Do not receive a live vaccine while using tofacitinib, or you could develop a serious infection. Live vaccines include measles, mumps, rubella (MMR), polio, rotavirus, typhoid, yellow fever, varicella (chickenpox), and zoster (shingles).  What are the possible side effects of tofacitinib?  Get emergency medical help if you have signs of an allergic reaction: hives; difficult breathing; swelling of your face, lips, tongue, or throat.  You may get infections more easily, even serious or fatal infections. Call your doctor right away if you have signs of infection such as:  ?? fever, chills, sweating;  ?? skin sores;  ?? tiredness, muscle pain;  ?? increased urination, pain or burning when you urinate;  ?? stomach pain, diarrhea, weight loss; or  ?? cough, shortness of breath, coughing up pink or red mucus.  Further doses may be delayed until your infection clears up.  Also call your doctor at once if you have:  ?? low red blood cells (anemia) --pale skin, unusual tiredness, feeling light-headed or short of breath, cold hands and feet;  ?? signs of hepatitis --loss of appetite, vomiting, stomach pain (upper right side), fever, tiredness, dark urine, clay-colored stools, jaundice (yellowing of the skin or eyes);  ?? shingles --burning pain, numbness, tingling, itching, skin rash or blisters;  ?? signs of perforation (a hole or tear) in your stomach or intestines --fever, ongoing stomach pain, change in bowel habits; or  ?? signs of tuberculosis: fever, cough, night sweats,  loss of appetite, weight loss, and feeling very tired.  Common side effects may include:  ?? skin rash, shingles;  ?? increased blood pressure;  ?? abnormal blood tests;  ?? headache;  ?? diarrhea; or  ?? cold symptoms such as stuffy nose, sneezing, sore throat.  This is not a complete list of side effects and others may occur. Call your doctor for medical advice about side effects. You may report side effects to FDA at 1-800-FDA-1088.  What other drugs will affect tofacitinib?  Sometimes it is not safe to use certain medications at the same time. Some drugs can affect your blood levels of other drugs you take, which may increase side effects or make the medications less effective.  Tell your doctor about all your current medicines. Many drugs can affect tofacitinib, especially:  ?? azathioprine;  ?? cyclosporine;  ?? tuberculosis medicine; or  ?? other drugs to arthritis or ulcerative colitis --abatacept, adalimumab, anakinra, certolizumab, etanercept, golimumab, infliximab, rituximab, secukinumab, tocilizumab, ustekinumab, vedolizumab.  This list is not complete and many other drugs may affect tofacitinib. This includes prescription and over-the-counter medicines, vitamins, and herbal products. Not all possible drug interactions are listed here.  Where can I get more information?  Your pharmacist can provide more information about tofacitinib.    Remember, keep this and all other medicines out of the reach of children, never share your medicines with others, and use this medication only for the indication prescribed.  Every effort has been made to ensure that the information provided by [employer] ('[employer]') is accurate, up-to-date, and complete, but no guarantee is made to that effect. Drug information contained herein may be time sensitive. [employer] information has been compiled for use by healthcare practitioners and consumers in the [place] and therefore [employer] does not warrant that uses outside of the [place] are appropriate, unless specifically indicated otherwise. [employer]'s drug information does not endorse drugs, diagnose patients or recommend therapy. [employer]'s drug information is an [employer], corporate to assist licensed healthcare practitioners in caring for their patients and/or to serve consumers viewing this service as a supplement to, and not a substitute for, the expertise, skill, knowledge and judgment of healthcare practitioners. The absence of a warning for a given drug or drug combination in no way should be construed to indicate that the drug or drug combination is safe, effective or appropriate for any given patient. Mult[employer] does not assume any responsibility for any aspect of healthcare administered with the aid of information [employer] provides. The information contained herein is not intended to cover all possible uses, directions, precautions, warnings, drug interactions, allergic reactions, or adverse effects. If you have questions about the drugs you are taking, check with your doctor, nurse or pharmacist.  Copyright [phone] [employer] Version: 3.01. Revision date: 05/13/2017.  Care instructions adapted under license by [facility]. If you have questions about a medical condition or this instruction, always ask your healthcare professional. [employer] disclaims any warranty or liability for your use of this information. The patient indicates understanding of these issues and agrees to the plan as outlined above.  Contact information provided for any concerns or questions in the interim.    Kinney Sackmann C. Scarlette Calico, MD, PhD  Assistant Professor of Medicine  Department of Medicine/Division of Rheumatology  [facility]

## 2017-11-04 NOTE — Unmapped (Signed)
Per test claim for [facility] at the [facility], patient needs Medication Assistance Program for Prior Authorization.

## 2017-11-08 ENCOUNTER — Encounter
Admit: 2017-11-08 | Discharge: 2017-11-08 | Payer: MEDICARE | Attending: Hematology & Oncology | Primary: Hematology & Oncology

## 2017-11-08 DIAGNOSIS — Z1211 Encounter for screening for malignant neoplasm of colon: Principal | ICD-10-CM

## 2017-11-12 NOTE — Unmapped (Signed)
Specimen related to immunochemical fecal occult blood test kit has been received back to clinic.  The appropriate collection process is been completed and the specimen is been forwarded on to the [facility] core lab for additional processing.

## 2017-12-10 NOTE — Unmapped (Signed)
Message left to call on the patients voicemail.

## 2017-12-10 NOTE — Unmapped (Signed)
Denice M Castell  Lonny Eisen D McKiver, LPN      ??      This is what I found out :     Date Time Type Summary User   12/10/2017 9:10 AM EDT Medication Prior Authorization Note [facility] Referral: Mfr Assistance Application - Outcome Pending EARHART, MEGAN E   Note Text:   [facility] Referral: Mfr Assistance Application - Outcome Pending   ??   Medication: Harriette Ohara   Copay: $161.09   Manufacturer Program: Chief of Staff #: [phone] (phone) OR [phone] (fax)   ??   Spoke with Linde Gillis, contact representative at manufacturer assistance program. ??   Application Status: Pending decision at this time   ??   MAP Status Check Call Hold Time: 2 minutes     My friend from [facility] [employer] is working on this patient. Let me know if this helps.     Thanks

## 2017-12-20 NOTE — Unmapped (Signed)
[facility] - Pharmacist Counseling Notes    Shelly Cooper is a 77 y.o. female being initiate on Xeljanz for rheumatoid arthritis. Medication list, allergies and comorbidities reviewed:  appropriate to initiate therapy.   She is receiving Harriette Ohara through the mfg, [employer].  Patient states she is familiar with Xeljanz's side effect profile.  Reviewed GI side effects and risk of infections with patient.      Regimen & Administration: Xeljanz 5 mg by mouth BID.    Administer without regards to meal.  If a dose is missed, administer as soon as it is remembered. If it is close to the next regularly scheduled time; do not double a dose to make up for for a missed dose    Storage/Handling/Disposal:  Room Temperature.  Patient was counseled on the handling of hazardous agent and will use precautions to minimize contact/exposure to medicine.      Emphasized the importance of adherence to prescribed regimen, clinic follow-up visits, and laboratory testing.      All questions were answered and contact information provided for any future questions/concerns.      Karthik K Chandrasekar    Geoffry Paradise, PharmD, BCPS, CPP  [facility]

## 2017-12-20 NOTE — Unmapped (Signed)
Patient has been approved to receive Shelly Cooper from the manufacturer from  12/10/17 until 08/30/18 and will be shipped directly to the patient's home.

## 2018-03-17 MED ORDER — PREDNISONE 5 MG TABLET
ORAL_TABLET | 1 refills | 0 days | Status: CP
Start: 2018-03-17 — End: 2018-03-21

## 2018-03-21 MED ORDER — PREDNISONE 5 MG TABLET
ORAL_TABLET | 1 refills | 0 days | Status: CP
Start: 2018-03-21 — End: 2018-04-05

## 2018-03-28 MED ORDER — HYDROXYCHLOROQUINE 200 MG TABLET
ORAL_TABLET | Freq: Every day | ORAL | 3 refills | 0.00000 days | Status: CP
Start: 2018-03-28 — End: 2018-11-17

## 2018-04-05 MED ORDER — PREDNISONE 10 MG TABLET
ORAL_TABLET | Freq: Every day | ORAL | 3 refills | 0.00000 days | Status: CP
Start: 2018-04-05 — End: 2018-11-17

## 2018-04-07 ENCOUNTER — Encounter (INDEPENDENT_AMBULATORY_CARE_PROVIDER_SITE_OTHER): Payer: Medicare Other | Admitting: Ophthalmology

## 2018-04-07 DIAGNOSIS — H35033 Hypertensive retinopathy, bilateral: Secondary | ICD-10-CM | POA: Diagnosis not present

## 2018-04-07 DIAGNOSIS — M069 Rheumatoid arthritis, unspecified: Secondary | ICD-10-CM

## 2018-04-07 DIAGNOSIS — H43813 Vitreous degeneration, bilateral: Secondary | ICD-10-CM | POA: Diagnosis not present

## 2018-04-07 DIAGNOSIS — I10 Essential (primary) hypertension: Secondary | ICD-10-CM | POA: Diagnosis not present

## 2018-04-07 DIAGNOSIS — Z79899 Other long term (current) drug therapy: Secondary | ICD-10-CM

## 2018-04-07 DIAGNOSIS — H353132 Nonexudative age-related macular degeneration, bilateral, intermediate dry stage: Secondary | ICD-10-CM | POA: Diagnosis not present

## 2018-04-21 ENCOUNTER — Encounter (INDEPENDENT_AMBULATORY_CARE_PROVIDER_SITE_OTHER): Payer: Medicare Other | Admitting: Ophthalmology

## 2018-04-21 DIAGNOSIS — H26491 Other secondary cataract, right eye: Secondary | ICD-10-CM | POA: Diagnosis not present

## 2018-05-03 ENCOUNTER — Encounter (INDEPENDENT_AMBULATORY_CARE_PROVIDER_SITE_OTHER): Payer: Medicare Other | Admitting: Ophthalmology

## 2018-05-03 DIAGNOSIS — H2701 Aphakia, right eye: Secondary | ICD-10-CM

## 2018-05-18 ENCOUNTER — Encounter: Admit: 2018-05-18 | Discharge: 2018-05-19 | Payer: MEDICARE | Attending: Dermatology | Primary: Dermatology

## 2018-05-18 DIAGNOSIS — L570 Actinic keratosis: Secondary | ICD-10-CM

## 2018-05-18 DIAGNOSIS — L814 Other melanin hyperpigmentation: Secondary | ICD-10-CM

## 2018-05-18 DIAGNOSIS — L821 Other seborrheic keratosis: Secondary | ICD-10-CM

## 2018-05-18 DIAGNOSIS — D485 Neoplasm of uncertain behavior of skin: Principal | ICD-10-CM

## 2018-05-19 ENCOUNTER — Encounter: Admit: 2018-05-19 | Discharge: 2018-05-20 | Payer: MEDICARE

## 2018-05-19 DIAGNOSIS — M0600 Rheumatoid arthritis without rheumatoid factor, unspecified site: Principal | ICD-10-CM

## 2018-05-19 DIAGNOSIS — D472 Monoclonal gammopathy: Secondary | ICD-10-CM

## 2018-05-19 DIAGNOSIS — Z79899 Other long term (current) drug therapy: Secondary | ICD-10-CM

## 2018-05-19 LAB — C-REACTIVE PROTEIN
C reactive protein:MCnc:Pt:Ser/Plas:Qn:: <5
C-REACTIVE PROTEIN: <5 mg/L (ref <10.0)

## 2018-05-19 LAB — COMPREHENSIVE METABOLIC PANEL
ALBUMIN: 3.9 g/dL (ref 3.5–5.0)
ALKALINE PHOSPHATASE: 87 U/L (ref 38–126)
ALT (SGPT): 18 U/L (ref 13–69)
ANION GAP: 7 mmol/L — ABNORMAL LOW (ref 9–15)
AST (SGOT): 24 U/L (ref 17–47)
BLOOD UREA NITROGEN: 24 mg/dL — ABNORMAL HIGH (ref 7–21)
BUN / CREAT RATIO: 23
CHLORIDE: 105 mmol/L (ref 98–107)
CO2: 28 mmol/L (ref 22.0–30.0)
EGFR CKD-EPI AA FEMALE: 60 mL/min/{1.73_m2} (ref >=60)
EGFR CKD-EPI NON-AA FEMALE: 52 mL/min/{1.73_m2} — ABNORMAL LOW (ref >=60)
GLUCOSE RANDOM: 92 mg/dL (ref 65–179)
POTASSIUM: 4.3 mmol/L (ref 3.5–5.0)
PROTEIN TOTAL: 6.5 g/dL (ref 6.5–8.3)
SODIUM: 140 mmol/L (ref 135–145)

## 2018-05-19 LAB — CBC W/ AUTO DIFF
BASOPHILS RELATIVE PERCENT: 0.7 %
EOSINOPHILS ABSOLUTE COUNT: 0.4 10*9/L (ref 0.0–0.4)
EOSINOPHILS RELATIVE PERCENT: 6.3 %
HEMATOCRIT: 40.4 % (ref 36.0–46.0)
HEMOGLOBIN: 12.7 g/dL (ref 12.0–16.0)
LARGE UNSTAINED CELLS: 3 % (ref 0–4)
LYMPHOCYTES ABSOLUTE COUNT: 1.4 10*9/L — ABNORMAL LOW (ref 1.5–5.0)
LYMPHOCYTES RELATIVE PERCENT: 22.6 %
MEAN CORPUSCULAR HEMOGLOBIN CONC: 31.5 g/dL (ref 31.0–37.0)
MEAN CORPUSCULAR HEMOGLOBIN: 28.9 pg (ref 26.0–34.0)
MEAN CORPUSCULAR VOLUME: 91.5 fL (ref 80.0–100.0)
MEAN PLATELET VOLUME: 6.8 fL — ABNORMAL LOW (ref 7.0–10.0)
MONOCYTES ABSOLUTE COUNT: 0.4 10*9/L (ref 0.2–0.8)
MONOCYTES RELATIVE PERCENT: 5.9 %
NEUTROPHILS ABSOLUTE COUNT: 3.7 10*9/L (ref 2.0–7.5)
PLATELET COUNT: 264 10*9/L (ref 150–440)
RED CELL DISTRIBUTION WIDTH: 13 % (ref 12.0–15.0)
WBC ADJUSTED: 6 10*9/L (ref 4.5–11.0)

## 2018-05-19 LAB — MEAN PLATELET VOLUME: Lab: 6.8 — ABNORMAL LOW

## 2018-05-19 LAB — BLOOD UREA NITROGEN: Urea nitrogen:MCnc:Pt:Ser/Plas:Qn:: 24 — ABNORMAL HIGH

## 2018-05-19 LAB — ERYTHROCYTE SEDIMENTATION RATE: Lab: 4

## 2018-05-19 MED ORDER — ABATACEPT 125 MG/ML SUBCUTANEOUS AUTO-INJECTOR
SUBCUTANEOUS | 3 refills | 0 days | Status: CP
Start: 2018-05-19 — End: ?

## 2018-05-19 NOTE — Unmapped (Signed)
Patient Education        abatacept  Pronunciation:  a BAY ta sept  Brand:  Orencia  What is the most important information I should know about abatacept?  Follow all directions on your medicine label and package. Tell each of your healthcare providers about all your medical conditions, allergies, and all medicines you use.  What is abatacept?  Abatacept a protein that prevents your body's immune system from attacking healthy tissues such as joints. The immune system helps your body fight infections. In people with autoimmune disorders, the immune system mistakes the body's own cells for invaders and attacks them.  Abatacept is used to treat the symptoms of rheumatoid arthritis, and to prevent joint damage caused by these conditions. This medicine is for adults and children who are at least 65 years old.  Abatacept is also used to treat active psoriatic arthritis in adults.  Abatacept is not a cure for any autoimmune disorder and will only treat the symptoms of your condition.  Abatacept may also be used for purposes not listed in this medication guide.  What should I discuss with my healthcare provider before using abatacept?  You should not use abatacept if you are allergic to it.  Before using abatacept, tell your doctor if you have ever had tuberculosis, if anyone in your household has tuberculosis, or if you have recently traveled to an area where tuberculosis is common.  To make sure abatacept is safe for you, tell your doctor if you have ever had:  ?? a weak immune system;  ?? any type of infection including a skin infection or open sores;  ?? infections that go away and come back;  ?? COPD (chronic obstructive pulmonary disease);  ?? diabetes;  ?? hepatitis; or  ?? if you are scheduled to receive any vaccines.  Using abatacept may increase your risk of developing certain types of cancer such as lymphoma (cancer of the lymph nodes). This risk may be greater in older adults. Talk to your doctor about your specific risk.  It is not known whether abatacept will harm an unborn baby. Tell your doctor if you are pregnant or plan to become pregnant.  If you are pregnant, your name may be listed on a pregnancy registry. This is to track the outcome of the pregnancy and to evaluate any effects of abatacept on the baby.  It is not known whether abatacept passes into breast milk or if it could affect the nursing baby. Tell your doctor if you are breast-feeding.  Children using abatacept should be current on all childhood immunizations before starting treatment.  How should I use abatacept?  Before you start treatment with abatacept, your doctor may perform tests to make sure you do not have tuberculosis or other infections.  Abatacept is injected under the skin, or into a vein through an IV. You may be shown how to use injections at home. Do not give yourself this medicine if you do not understand how to use the injection and properly dispose of needles, IV tubing, and other items used.  Abatacept is injected under the skin when given to a child between 75 and 6 years old.  Abatacept must be given slowly when injected into a vein, and the IV infusion can take at least 30 minutes to complete.  This medicine is usually given every 1 to 4 weeks. Follow your doctor's instructions.  You may need to mix abatacept with a liquid (diluent) before using it. If you are using  the injections at home, be sure you understand how to properly mix and store the medication.  Do not shake the medication bottle or you may ruin the medicine. Prepare your dose only when you are ready to give an injection. Do not use if the medicine has changed colors or has particles in it. Call your pharmacist for new medicine.  Each single-use vial (bottle) or prefilled syringe of this medicine is for one use only. Throw away after one use, even if there is still some medicine left in it after injecting your dose.  Use a disposable needle and syringe only once. Follow any state or local laws about throwing away used needles and syringes. Use a puncture-proof sharps disposal container (ask your pharmacist where to get one and how to throw it away). Keep this container out of the reach of children and pets.  If you need surgery, tell the surgeon ahead of time that you are using abatacept.  If you have ever had hepatitis B, abatacept can cause this condition to come back or get worse. You will need frequent blood tests to check your liver function during treatment and for several months after you stop using this medicine.  This medicine can cause false results with certain blood glucose tests, showing high blood sugar readings. If you have diabetes, talk to your doctor about the best way to check your blood sugar while you are using abatacept.  Autoimmune disorders are often treated with a combination of different drugs. Use all medications as directed by your doctor. Read the medication guide or patient instructions provided with each medication. Do not change your doses or medication schedule without your doctor's advice.  Store abatacept in the refrigerator. Do not freeze. Keep the medicine in original carton to protect it from light. Do not use abatacept if the expiration date on the medicine label has passed.  If you need to transport the medicine, place the syringes in a cooler with ice packs.  Abatacept that has been mixed with a diluent may be stored in a refrigerator or at room temperature and used within 24 hours.  What happens if I miss a dose?  Call your doctor for instructions if you miss your abatacept dose.  What happens if I overdose?  Seek emergency medical attention or call the Poison Help line at [phone].  What should I avoid while using abatacept?  Do not receive a live vaccine while using abatacept, and for at least 3 months after your treatment ends. The vaccine may not work as well during this time, and may not fully protect you from disease. Live vaccines include measles, mumps, rubella (MMR), polio, rotavirus, typhoid, yellow fever, varicella (chickenpox), zoster (shingles), and nasal flu (influenza) vaccine.  Avoid being near people who are sick or have infections. Tell your doctor at once if you develop signs of infection.  What are the possible side effects of abatacept?  Some side effects may occur during the injection. Tell your caregiver right away if you feel dizzy, light-headed, itchy, or have a severe headache or trouble breathing within 1 hour after receiving the injection.  Get emergency medical help if you have signs of an allergic reaction:  hives; difficulty breathing; swelling of your face, lips, tongue, or throat.  Serious and sometimes fatal infections may occur during treatment with abatacept. Stop using this medicine and call your doctor right away if you have signs of infection such as:  ?? fever, chills, night sweats, flu symptoms, weight loss;  ??  feeling very tired;  ?? dry cough, sore throat; or  ?? warmth, pain, or redness of your skin.  Call your doctor at once if you have any of these other serious side effects:  ?? trouble breathing;  ?? stabbing chest pain, wheezing, cough with yellow or green mucus;  ?? pain or burning when you urinate; or  ?? signs of skin infection such as itching, swelling, warmth, redness, or oozing.  Common side effects may include:  ?? fever;  ?? nausea, diarrhea, stomach pain;  ?? headache; or  ?? cold symptoms such as stuffy nose, sneezing, sore throat, cough.  This is not a complete list of side effects and others may occur. Call your doctor for medical advice about side effects. You may report side effects to FDA at 1-800-FDA-1088.  What other drugs will affect abatacept?  Tell your doctor about all your current medicines and any you start or stop using, especially:  ?? anakinra (Kineret);  ?? adalimumab (Humira);  ?? certolizumab (Cimzia);  ?? etanercept (Enbrel);  ?? golimumab (Simponi);  ?? infliximab (Remicade);  ?? rituximab (Rituxan); or  ?? tocilizumab (Actemra).  This list is not complete. Other drugs may interact with abatacept, including prescription and over-the-counter medicines, vitamins, and herbal products. Not all possible interactions are listed in this medication guide.  Where can I get more information?  Your doctor or pharmacist can provide more information about abatacept.  Remember, keep this and all other medicines out of the reach of children, never share your medicines with others, and use this medication only for the indication prescribed.  Every effort has been made to ensure that the information provided by [employer]') is accurate, up-to-date, and complete, but no guarantee is made to that effect. Drug information contained herein may be time sensitive. [employer] information has been compiled for use by healthcare practitioners and consumers in the [place] and therefore [employer] does not warrant that uses outside of the [place] are appropriate, unless specifically indicated otherwise. [employer]'s drug information does not endorse drugs, diagnose patients or recommend therapy. [employer]'s drug information is an [employer] corporate to assist licensed healthcare practitioners in caring for their patients and/or to serve consumers viewing this service as a supplement to, and not a substitute for, the expertise, skill, knowledge and judgment of healthcare practitioners. The absence of a warning for a given drug or drug combination in no way should be construed to indicate that the drug or drug combination is safe, effective or appropriate for any given patient. [employer] does not assume any responsibility for any aspect of healthcare administered with the aid of information [employer] provides. The information contained herein is not intended to cover all possible uses, directions, precautions, warnings, drug interactions, allergic reactions, or adverse effects. If you have questions about the drugs you are taking, check with your doctor, nurse or pharmacist.  Copyright [phone] [employer] Version: 7.03. Revision date: 05/13/2016.  Care instructions adapted under license by [facility]. If you have questions about a medical condition or this instruction, always ask your healthcare professional. [employer] disclaims any warranty or liability for your use of this information.

## 2018-05-19 NOTE — Unmapped (Signed)
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  Pt also has a history of non-melanoma skin cancer but was cleared by her dermatologist to start Enbrel, which was started in November 2016 but discontinued in May 2017 due to worsening squamous cell skin lesions. She was continued hydroxychloroquine and started on Sulfasalazine in May 2017 but we have not been able to titrate dose beyond 500 mg po bid due to cough and concerns of possible side effects. She was last seen by myself in November 2017 following a recent shingles flare with evidence for increased LE edema so no medication adjustments made at that time. She called in December 2017 stating that the sulfasalazine was causing swelling in the face and so advised her to stop. She reported 22lb weight loss after stopping the sulfasalazine. She was then seen by Carlus Pavlov PA-C in February 2018 and had recently been diagnosed with pericarditis and started on colchicine. 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 At follow-up in June 2018, reported tapering off colchicine and medrol dose pack for pleuritis and pericarditis. She reported compliance with hydroxychloroquine 200 mg qd. We had discussed starting Actemra/Tocilizumab for RA with pericarditis and pleuritis and patient finally agreed to start following cardiology approval in October 2018. Due to $500 copay cost following first infusion, patient declined continuing with tocilizumab infusions. She was not interested in pursuing [place] injections with tocilizumab or other therapy.     HPI: Shelly Cooper is a 77 y.o. 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  She was last evaluated by me in March 2019 and at that visit she reported that she had no benefit with tocilizumab with the one infusion she had. She had evidence for active RA. Limited in options as she has macular degeneration and ophthalmologist did not want more than hydroxychloroquine 200 mg daily dosing.  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 At last visit, we discussed tofacitinib and she agreed to try. Unfortunately, one week after starting tofacitinib she reported abdominal pain and h/o diverticulitis so this was discontinued. She returns today for follow-up.     She reports that she had to stop prednisone due to cardiac issues. Cardiac work-up unrevealing at the moment. She reports RA has been more active. She complains of pain in her shoulders and hips. She has had swelling in her hands. She reports pain in hands following driving. She continues on hydroxychloroquine 200 mg daily.  She had skin biopsies yesterday with dermatology. We discussed at Orencia (abatacept). She did have good results with Enbrel but the issue would be her skin cancers and h/o CHF but heart failure is more diastolic so not a contraindication.  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 In any case, tocilizumab and tofacitinib have not provided benefit or caused side effects to her.     ROS??:Attests to the above otherwise review of all other system is negative.??  ??  Past Medical and Surgical History:  ??  Patient Active Problem List    Diagnosis Date Noted   ??? Chest  pain 09/15/2016   ??? Seronegative rheumatoid arthritis (CMS-HCC) 03/14/2015   ??? De Quervain's tenosynovitis, bilateral 07/17/2014   ??? Nonspecific immunological findings 03/14/2014   ??? Edema 01/03/2014   ??? Primary localized osteoarthrosis, lower leg 07/01/2012   ??? Derangement of medial meniscus 06/23/2012 ??? Other specified cardiac dysrhythmias 03/17/2012   ??? Coronary atherosclerosis (RAF-HCC) 01/29/2012   ??? Essential hypertension 01/29/2012   ??? 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 HYSTERECTOMY  1978    fibroids   ??? NASAL POLYP EXCISION     ??? STENTS [facility] HISTORICAL RESULT)  2001    post-MI   ??? TRIGGER FINGER RELEASE       Allergies:   ??  Allergies   Allergen Reactions   ??? Aspirin Swelling     Other reaction(s): Swelling  Facial swelling  Other reaction(s): UNKNOWN   ??? Lyrica  [Pregabalin] Swelling     Other reaction(s): Swelling  Other reaction(s): SWELLING     Current Outpatient Medications:  ??  Current Outpatient Medications:   ???  cetirizine (ZYRTEC) 10 MG tablet, Take 10 mg by mouth daily as needed., Disp: , Rfl:   ???  clopidogrel (PLAVIX) 75 mg tablet, Take 75 mg by mouth daily., Disp: , Rfl:   ???  hydroxychloroquine (PLAQUENIL) 200 mg tablet, Take 1 tablet (200 mg total) by mouth daily., Disp: 90 tablet, Rfl: 3  ???  lansoprazole (PREVACID) 30 MG capsule, Take 1 capsule by mouth once daily., Disp: , Rfl:   ???  losartan (COZAAR) 100 MG tablet, Take 100 mg by mouth., Disp: , Rfl:   ???  predniSONE (DELTASONE) 10 MG tablet, Take 1 tablet (10 mg total) by mouth daily., Disp: 90 tablet, Rfl: 3  ???  torsemide (DEMADEX) 20 MG tablet, Take 20 mg by mouth daily as needed. , Disp: , Rfl:   ???  VIT C/VIT E AC/LUT/COPPER/ZINC (PRESERVISION LUTEIN ORAL), Take 1 tablet by mouth daily., Disp: , Rfl:   ???  tofacitinib (XELJANZ) 5 mg Tab tablet, TAKE 1 TABLET (5 MG TOTAL) BY MOUTH TWO (2) TIMES A DAY. (Patient not taking: Reported on 05/19/2018), Disp: 60 tablet, Rfl: 11      Immunization History   Administered Date(s) Administered   ??? INFLUENZA TIV (TRI) 79MO+ W/ PRESERV (IM) 06/14/2014   ??? INFLUENZA TIV (TRI) PF (IM) 06/15/2007, 06/05/2008, 06/10/2010, 06/30/2011   ??? Influenza Vaccine Quad (IIV4 PF) 70mo+ injectable 05/17/2015, 06/10/2017   ??? Influenza Virus Vaccine, unspecified formulation 06/24/2014, 06/02/2016   ??? PNEUMOCOCCAL POLYSACCHARIDE 23 10/06/2016   ??? Pneumococcal Conjugate 13-Valent 04/03/2014     ????  PHYSICAL EXAM??  Vital signs: BP 135/86 (BP Site: L Arm, BP Position: Sitting, BP Cuff Size: Medium)  - Pulse 69  - Temp 35.9 ??C (96.7 ??F) (Oral)  - Ht 162.6 cm (5' 4.02)  - Wt 88.7 kg (195 lb 9.6 oz)  - BMI 33.56 kg/m?? Body mass index is 33.56 kg/m??.  Gen: Well-developed, well-nourished adult in no apparent distress.  Pleasant and cooperative. AOx4.?  HEENT: PERRLA, EOMI, MMM, oropharynx in pink without ulcerations or exudates visualized.??    Lungs: Broad chest excursion with good air movement. CTAB without wheezing/rhonchi/rales. ????  CV: RRR, Normal S1/S2, No murmurs/rubs/gallops heard.   PV: Warm, 2+ radial and pedal pulses, no C/C/E.????  Neuro: Good comprehension/cognition. CN 2-12 intact.  Gait intact  Comprehensive Musculoskeletal Examination:????  ?? Jaw, neck without limited ROM.????  ??  Shoulders, elbows, wrists, hands, fingers:  Squaring at [facility] bilaterally.  2nd and 3rd MCPs on right are swollen and tender. Unable to make a tight fist and grip strength reduced on right. Bilateral shoulders with pain with active and passive ROM, worse on right. Shoulders are slightly stiff.  Wrists and elbows wnl.  ?? Bilateral hips with slightly limited external rotation, maybe worse on the right.  ?? Knee, ankles, feet, toes: No deformity, erythema, limited ROM. No MTP tenderness.  Skin: Scattered and extensive erythematous scaly lesions on bilateral lower extremities, likely related to the squamous cell. Traced edema    Swollen Joint Count (0-28): 2  Tender Joint Count (0-28): 4  Patient Global Assessment of Disease Activity (1-10): 8  Evaluator Global Assessment of Disease (1-10): 7  CDAI Score: 21  CDAI interpretation:  0.0-2.8 Remission   2.9-10.0 Low disease activity   10.1-22.0 Moderate disease activity   22.1-76.0 High disease activity     Results:   Recent Results (from the past 2016 hour(s))   CBC w/ Differential    Collection Time: 05/19/18 11:34 AM   Result Value Ref Range    WBC 6.0 4.5 - 11.0 10*9/L    RBC 4.41 4.00 - 5.20 10*12/L    HGB 12.7 12.0 - 16.0 g/dL    HCT 44.0 10.2 - 72.5 %    MCV 91.5 80.0 - 100.0 fL    MCH 28.9 26.0 - 34.0 pg    MCHC 31.5 31.0 - 37.0 g/dL    RDW 36.6 44.0 - 34.7 %    MPV 6.8 (L) 7.0 - 10.0 fL    Platelet 264 150 - 440 10*9/L    Neutrophils % 62.0 %    Lymphocytes % 22.6 %    Monocytes % 5.9 %    Eosinophils % 6.3 %    Basophils % 0.7 %    Absolute Neutrophils 3.7 2.0 - 7.5 10*9/L    Absolute Lymphocytes 1.4 (L) 1.5 - 5.0 10*9/L    Absolute Monocytes 0.4 0.2 - 0.8 10*9/L    Absolute Eosinophils 0.4 0.0 - 0.4 10*9/L    Absolute Basophils 0.0 0.0 - 0.1 10*9/L    Large Unstained Cells 3 0 - 4 %       ??GENERAL SUMMARY/IMPRESSION AND RECOMMENDATIONS: ??  ???In summary, the patient is a 77 y.o. female with seronegative RA and non-melanoma skin cancer s/p pericarditis and pleuritis in 2018 now on hydroxychloroquine 200 mg qd monotherapy. She does continue to have active RA with moderate disease activity as noted by CDAI score 21. Cannot take MTX and Leflunomide due to concerns for ILD. With sulfasalazine, facial swelling. Enbrel with worsening skin cancer.  Actemra infusions were too costly and patient reluctant to try self-injectable version.  Had one dose of tofacitinib complicated by abdominal pain and patient after starting revealed h/o diverticular disease. Ophthalmologist does not wish for patient to try higher dose of hydroxychloroquine to reach 5 mg/kg dosing level due to underlying macular degeneration.  Has not tried abatacept (Orencia) and we discussed this is an option today. Major risk would be infection and malignancy. If unable to tolerate Orencia or if it is ineffective, will consider another anti-TNF agent as she did have good response to etanercept in the past. Clinical pharmacist met with patient today to review Orencia and how to inject. Labs today to establish baseline prior to starting therapy.  Follow-up in three months with Carlus Pavlov [facility] and six months with myself, Dr. Scarlette Calico.  Diagnosis ICD-10-CM Associated Orders   1. Seronegative rheumatoid arthritis (CMS-HCC) M06.00 CBC w/ Differential     Comprehensive Metabolic Panel     CRP  C-Reactive Protein     ESR Sed rate     Serum Protein Electrophoresis and Immunofixation     Light Chains, serum quant     abatacept (ORENCIA CLICKJECT) 125 mg/mL AtIn     CBC w/ Differential     Comprehensive Metabolic Panel     CRP  C-Reactive Protein     ESR Sed rate     Serum Protein Electrophoresis and Immunofixation     Light Chains, serum quant     CBC w/ Differential   2. High risk medication use Z79.899    3. MGUS (monoclonal gammopathy of unknown significance) D47.2 Serum Protein Electrophoresis and Immunofixation     Light Chains, serum quant     Serum Protein Electrophoresis and Immunofixation     Light Chains, serum quant     Patient Instructions     Patient Education        abatacept  Pronunciation:  a BAY ta sept  Brand:  Orencia  What is the most important information I should know about abatacept?  Follow all directions on your medicine label and package. Tell each of your healthcare providers about all your medical conditions, allergies, and all medicines you use.  What is abatacept?  Abatacept a protein that prevents your body's immune system from attacking healthy tissues such as joints. The immune system helps your body fight infections. In people with autoimmune disorders, the immune system mistakes the body's own cells for invaders and attacks them.  Abatacept is used to treat the symptoms of rheumatoid arthritis, and to prevent joint damage caused by these conditions. This medicine is for adults and children who are at least 54 years old.  Abatacept is also used to treat active psoriatic arthritis in adults.  Abatacept is not a cure for any autoimmune disorder and will only treat the symptoms of your condition.  Abatacept may also be used for purposes not listed in this medication guide.  What should I discuss with my healthcare provider before using abatacept?  You should not use abatacept if you are allergic to it.  Before using abatacept, tell your doctor if you have ever had tuberculosis, if anyone in your household has tuberculosis, or if you have recently traveled to an area where tuberculosis is common.  To make sure abatacept is safe for you, tell your doctor if you have ever had:  ?? a weak immune system;  ?? any type of infection including a skin infection or open sores;  ?? infections that go away and come back;  ?? COPD (chronic obstructive pulmonary disease);  ?? diabetes;  ?? hepatitis; or  ?? if you are scheduled to receive any vaccines.  Using abatacept may increase your risk of developing certain types of cancer such as lymphoma (cancer of the lymph nodes). This risk may be greater in older adults. Talk to your doctor about your specific risk.  It is not known whether abatacept will harm an unborn baby. Tell your doctor if you are pregnant or plan to become pregnant.  If you are pregnant, your name may be listed on a pregnancy registry. This is to track the outcome of the pregnancy and to evaluate any effects of abatacept on the baby.  It is not known whether abatacept passes into breast milk or if it could affect the nursing  baby. Tell your doctor if you are breast-feeding.  Children using abatacept should be current on all childhood immunizations before starting treatment.  How should I use abatacept?  Before you start treatment with abatacept, your doctor may perform tests to make sure you do not have tuberculosis or other infections.  Abatacept is injected under the skin, or into a vein through an IV. You may be shown how to use injections at home. Do not give yourself this medicine if you do not understand how to use the injection and properly dispose of needles, IV tubing, and other items used.  Abatacept is injected under the skin when given to a child between 7 and 64 years old.  Abatacept must be given slowly when injected into a vein, and the IV infusion can take at least 30 minutes to complete.  This medicine is usually given every 1 to 4 weeks. Follow your doctor's instructions.  You may need to mix abatacept with a liquid (diluent) before using it. If you are using the injections at home, be sure you understand how to properly mix and store the medication.  Do not shake the medication bottle or you may ruin the medicine. Prepare your dose only when you are ready to give an injection. Do not use if the medicine has changed colors or has particles in it. Call your pharmacist for new medicine.  Each single-use vial (bottle) or prefilled syringe of this medicine is for one use only. Throw away after one use, even if there is still some medicine left in it after injecting your dose.  Use a disposable needle and syringe only once. Follow any state or local laws about throwing away used needles and syringes. Use a puncture-proof sharps disposal container (ask your pharmacist where to get one and how to throw it away). Keep this container out of the reach of children and pets.  If you need surgery, tell the surgeon ahead of time that you are using abatacept.  If you have ever had hepatitis B, abatacept can cause this condition to come back or get worse. You will need frequent blood tests to check your liver function during treatment and for several months after you stop using this medicine.  This medicine can cause false results with certain blood glucose tests, showing high blood sugar readings. If you have diabetes, talk to your doctor about the best way to check your blood sugar while you are using abatacept.  Autoimmune disorders are often treated with a combination of different drugs. Use all medications as directed by your doctor. Read the medication guide or patient instructions provided with each medication. Do not change your doses or medication schedule without your doctor's advice.  Store abatacept in the refrigerator. Do not freeze. Keep the medicine in original carton to protect it from light. Do not use abatacept if the expiration date on the medicine label has passed.  If you need to transport the medicine, place the syringes in a cooler with ice packs.  Abatacept that has been mixed with a diluent may be stored in a refrigerator or at room temperature and used within 24 hours.  What happens if I miss a dose?  Call your doctor for instructions if you miss your abatacept dose.  What happens if I overdose?  Seek emergency medical attention or call the Poison Help line at 4505707494.  What should I avoid while using abatacept?  Do not receive a live vaccine while using abatacept, and for at least  3 months after your treatment ends. The vaccine may not work as well during this time, and may not fully protect you from disease. Live vaccines include measles, mumps, rubella (MMR), polio, rotavirus, typhoid, yellow fever, varicella (chickenpox), zoster (shingles), and nasal flu (influenza) vaccine.  Avoid being near people who are sick or have infections. Tell your doctor at once if you develop signs of infection.  What are the possible side effects of abatacept?  Some side effects may occur during the injection. Tell your caregiver right away if you feel dizzy, light-headed, itchy, or have a severe headache or trouble breathing within 1 hour after receiving the injection.  Get emergency medical help if you have signs of an allergic reaction:  hives; difficulty breathing; swelling of your face, lips, tongue, or throat.  Serious and sometimes fatal infections may occur during treatment with abatacept. Stop using this medicine and call your doctor right away if you have signs of infection such as:  ?? fever, chills, night sweats, flu symptoms, weight loss;  ?? feeling very tired;  ?? dry cough, sore throat; or  ?? warmth, pain, or redness of your skin.  Call your doctor at once if you have any of these other serious side effects:  ?? trouble breathing;  ?? stabbing chest pain, wheezing, cough with yellow or green mucus;  ?? pain or burning when you urinate; or  ?? signs of skin infection such as itching, swelling, warmth, redness, or oozing.  Common side effects may include:  ?? fever;  ?? nausea, diarrhea, stomach pain;  ?? headache; or  ?? cold symptoms such as stuffy nose, sneezing, sore throat, cough.  This is not a complete list of side effects and others may occur. Call your doctor for medical advice about side effects. You may report side effects to FDA at 1-800-FDA-1088.  What other drugs will affect abatacept?  Tell your doctor about all your current medicines and any you start or stop using, especially:  ?? anakinra (Kineret);  ?? adalimumab (Humira);  ?? certolizumab (Cimzia);  ?? etanercept (Enbrel);  ?? golimumab (Simponi);  ?? infliximab (Remicade);  ?? rituximab (Rituxan); or  ?? tocilizumab (Actemra).  This list is not complete. Other drugs may interact with abatacept, including prescription and over-the-counter medicines, vitamins, and herbal products. Not all possible interactions are listed in this medication guide.  Where can I get more information?  Your doctor or pharmacist can provide more information about abatacept.  Remember, keep this and all other medicines out of the reach of children, never share your medicines with others, and use this medication only for the indication prescribed.  Every effort has been made to ensure that the information provided by [employer] ('[employer]') is accurate, up-to-date, and complete, but no guarantee is made to that effect. Drug information contained herein may be time sensitive. [employer] information has been compiled for use by healthcare practitioners and consumers in the [place] and therefore [employer] does not warrant that uses outside of the [place] are appropriate, unless specifically indicated otherwise. [employer]'s drug information does not endorse drugs, diagnose patients or recommend therapy. [employer]'s drug information is an [employer], corporate to assist licensed healthcare practitioners in caring for their patients and/or to serve consumers viewing this service as a supplement to, and not a substitute for, the expertise, skill, knowledge and judgment of healthcare practitioners. The absence of a warning for a given drug or drug combination in no way should be construed to indicate that the  drug or drug combination is safe, effective or appropriate for any given patient. Mult[employer] does not assume any responsibility for any aspect of healthcare administered with the aid of information [employer] provides. The information contained herein is not intended to cover all possible uses, directions, precautions, warnings, drug interactions, allergic reactions, or adverse effects. If you have questions about the drugs you are taking, check with your doctor, nurse or pharmacist.  Copyright [id number] [employer] Version: 7.03. Revision date: 05/13/2016.  Care instructions adapted under license by [facility]. If you have questions about a medical condition or this instruction, always ask your healthcare professional. [employer] disclaims any warranty or liability for your use of this information.           The patient indicates understanding of these issues and agrees to the plan as outlined above.  Contact information provided for any concerns or questions in the interim.    Lauryl Seyer C. Scarlette Calico, MD, PhD  Assistant Professor of Medicine  Department of Medicine/Division of Rheumatology  [facility]

## 2018-05-19 NOTE — Unmapped (Signed)
Per test claim for [facility] CLICKJECT 125 MG/ML at the [facility], patient needs Medication Assistance Program for Prior Authorization.

## 2018-05-19 NOTE — Unmapped (Signed)
[facility] - Pharmacist Counseling Notes    [name] is a 77 y.o. female being initiated on Orencia injection for rheumatoid arthritis. Medication list, allergies and comorbidities reviewed:  appropriate to initiate therapy.       Regimen & Administration: Orencia 125 mg subq once weekly.    Administer without regards to meal.  If a dose is missed, administer as soon as it is remembered and restart administration cycle    Storage/Handling/Disposal:  Refridgerated.  Patient will dispose of needles in a sharps container or empty laundry detergent bottle.      Drug-Drug & Drug-Food Interactions:  None noted    Side-effects:    ?? Discussed the risk of injection site reactions, headache, infections, abnormalities in blood counts, and malignancy. Patient does not have a history of COPD.   ?? In the case of signs of infections including fever, discomfort when urinating, sinus congestion, or other complaints, patient should hold the next dose of Orencia and call clinic to ensure adequate medical care  ?? In the case where a planned procedure is scheduled or live vaccines are indicated, patient should contact the clinic for further recommendations  ?? Counseled on signs of a significant drug reaction (wheezing, chest tightness, fever, itching, cough, blue skin color, seizures, swelling of face, lips, tongue or throat, etc)    Injection Training:   ?? Reviewed injection techniques and patient felt comfortable with self-administration at home.  Injection site-reactions discussed.    Emphasized the importance of adherence to prescribed regimen, clinic follow-up visits, and laboratory testing.      All questions were answered and contact information provided for any future questions/concerns.      Jeneen Montgomery

## 2018-05-20 LAB — IMMUNOGLOBULIN FREE LT CHAINS BLOOD
K/L FLC RATIO: 1.08 (ref 0.26–1.65)
KAPPA FREE,SERUM: 2.25 mg/dL — ABNORMAL HIGH (ref 0.33–1.94)

## 2018-05-20 LAB — KAPPA FREE,SERUM: Lab: 2.25 — ABNORMAL HIGH

## 2018-05-23 LAB — SERUM PROTEIN ELECTROPHORESIS AND IMMUNOFIXATION
ALPHA-1 GLOBULIN: 0.3 g/dL (ref 0.2–0.5)
ALPHA-2 GLOBULIN: 0.8 g/dL (ref 0.5–1.1)
BETA-1 GLOBULIN: 0.4 g/dL (ref 0.3–0.6)
BETA-2 GLOBULIN: 0.4 g/dL (ref 0.2–0.6)
GAMMAGLOBULIN: 0.7 g/dL (ref 0.5–1.5)
PROTEIN TOTAL: 6.5 g/dL (ref 6.5–8.3)

## 2018-05-23 LAB — PROTEIN TOTAL: Protein:MCnc:Pt:Ser/Plas:Qn:: 6.5

## 2018-05-25 NOTE — Unmapped (Signed)
Please schedule for surgery appt, plan for [facility] of A and excision of B at that appt. She is traveling in October and prefers to schedule for November.      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   B:  Right thigh, shave   -Basal cell carcinoma, nodular growth pattern, extending to peripheral    edge and base. [FORMATTING REMOVED]  ----------    Called patient and notified of result. Will schedule for excision of B and ED&C of A.     Dx:    BCC  Excision:  trunk, arms, legs: 1.1-2cm  11602  Repair: scalp, trunk, arms, legs, axillae: 2.6 cm -7.5  12032

## 2018-05-25 NOTE — Unmapped (Addendum)
Dermatology Clinic Visit  Assessment and Plan:    [name] was seen today for skin check.    Diagnoses and all orders for this visit:    Neoplasm of uncertain behavior of skin  -     Dermatopathology Order    Actinic keratoses    Lentigines    Seborrheic keratoses      Neoplasm unspecifiedx2  -Lesion A Site: L back  - Ddx: BCC vs SCC  - Lesion B Site: R thigh  Ddx: BCC vs SCC  - After verbal consent obtained, areas anesthetized with 1% lidocaine with epinephrine. Biopsiesx2 performed using shave technique and hemostasis achieved w/ aluminum chloride. Areas dressed with petroleum jelly and a bandage. Patient instructed on post-procedure care. Will contact with pathology results when available.     Actinic keratosesx6  - Counseled on pre-malignant potential and treatment options.  - After verbal consent obtained, AK(s) treated with a single 10-second freeze-thaw cycle of cryotherapy. Patient counseled on post-cryotherapy care.   - Patient understands to return for evaluation of any lesions that do not resolve following cryotherapy.    Benign Lesions Seborrheic Keratoses and Lentigenes   - Reassurance provided regarding the benign appearance of lesions noted on exam today, and that no treatment is indicated in the absence of symptoms/changes.  - ABCDEs of pigmented lesions were discussed.  - Encouraged regular self-skin monitoring as well as annual clinical examinations for surveillance.    History of NMSC   - No evidence of local recurrence. No other lesions on today's exam concerning for malignancy or pre-malignancy. Counseled on the importance of sun protection and avoidance. Reviewed warning signs of skin cancer.     Return in about 4 months (around 09/17/2018).      Chief Complaint: Skin Check (FBSC, areas of concern, spot under brastrap , right upper thigh )     HPI: Shelly Cooper is a 77 y.o. female new patient who presents for evaluation of lesions of concern.  - R thigh, red spot present for months, has bled, never heals,  no alleviating/exacerbating factors, no prior tx  - L back under bra strap, present for months, growing, worse when rubbed  Denies other growing, bleeding, changing, tender, or nonhealing skin lesions.   There are no other skin concerns reported today.    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 She has a 2.50 pack-year smoking history. She has never used smokeless tobacco. She reports that she does not drink alcohol or use drugs.     Family History: Negative for melanoma    Review of Systems: Baseline state of health. No recent illnesses, denies fevers, chills, loss of appetite or weight changes. No other skin complaints except as noted per HPI. All other systems reviewed were negative.    Physical Examination:  General: Well-developed, well-nourished female in no acute distress, resting comfortably.  Neuro: A, A, Ox 3. Answers questions appropriately.  Psych: Pleasant affect without signs of depression or anxiety.   Skin: Examination by inspection and palpation of the scalp, face including eyelids, neck, chest, abdomen, back, buttocks, bilateral upper extremities, bilateral lower extremities, palms, soles, and nails was performed and notable for the following:  - Scaly erythematous macules on the face  Multiple tan-brown waxy keratotic papules on the trunk and extremities.  Scattered tan-brown macules and thin papules on sun exposed sites.  Moderate  chronic photodamage  Hyperkeratotic papule on L back  Erythematous papule on R thigh            All areas not specifically commented on are within normal limits.

## 2018-05-25 NOTE — Unmapped (Signed)
-----   Message from Marylene Land, MD sent at 05/25/2018 11:39 AM EDT -----  Please schedule for surgery appt, plan for [facility] of A and excision of B at that appt. She is traveling in October and prefers to schedule for November.      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   B:  Right thigh, shave   -Basal cell carcinoma, nodular growth pattern, extending to peripheral    edge and base. [FORMATTING REMOVED]  ----------    Called patient and notified of result. Will schedule for excision of B and ED&C of A.     Dx:    BCC  Excision:  trunk, arms, legs: 1.1-2cm  11602  Repair: scalp, trunk, arms, legs, axillae: 2.6 cm -7.5  12032

## 2018-05-26 NOTE — Unmapped (Signed)
[facility] RHEUMATOLOGY [facility]IN[facility] - PHARMACIST NOTES    Orencia injection PA denied with reason: patient would be taking Orencia in combination with another biologic DMARD or targeted synthetic DMARD.  This was entered in error during PA submission.  Unfortunately insurance does not allow for re-submission or correction of original PA.  An appeal letter was sent to [employer] team for submission today.     Chelsea Aus

## 2018-07-12 ENCOUNTER — Encounter: Admit: 2018-07-12 | Discharge: 2018-07-13 | Payer: MEDICARE | Attending: Dermatology | Primary: Dermatology

## 2018-07-12 DIAGNOSIS — Z85828 Personal history of other malignant neoplasm of skin: Secondary | ICD-10-CM

## 2018-07-12 DIAGNOSIS — C44529 Squamous cell carcinoma of skin of other part of trunk: Secondary | ICD-10-CM

## 2018-07-12 DIAGNOSIS — L570 Actinic keratosis: Principal | ICD-10-CM

## 2018-07-12 DIAGNOSIS — C44712 Basal cell carcinoma of skin of right lower limb, including hip: Secondary | ICD-10-CM

## 2018-07-12 NOTE — Unmapped (Signed)
ED&C     ED&C or curettage is a procedure in which a cancerous or precancerous area of the skin is removed. The abnormal cells are more fragile than the healthy cells surrounding the area, thus they are easily removed with scraping with a sharp instrument. To stop any bleeding, the scraping may be followed by light burning. The combination of scraping and burning is called electrodesiccation and curettage or ED &C. For certain types of cancer or precancerous changes, the scraping and burning cycle may be repeated.     To care for the area: Leave the bandage in place until the morning after your procedure is performed. On a daily basis, carefully remove the bandage, then shower or wash as usual. Allow water to run over the site. Please do not scrub. Carefully dry the area, then apply ointment (some people develop an allergy to Neosporin, so we recommend Vaseline or Aquaphor). Cover the site with a fresh bandage. Should any bleeding occur, apply firm pressure for 15 minutes. The treated site will heal best if  a scab never forms (the wound heals by new skin cells traveling from the outside toward the middle-their journey is easier if no scab stands in their way).    Long-term care: the site will be more sensitive than your surrounding skin. Keep it covered, and remember to apply sunscreen every day to all your exposed skin. A scar may remain which is lighter or pinker than your normal skin. Your body will continue to improve your scar for up to one year.    Infection following this procedure is rare. However, if you are worried about the appearance of your site, contact your doctor. Complete healing of an ED &C site may take up to one month or 6 weeks. We have a physician on call at all times. If you have any concerns about the site, please call our clinic at [phone]

## 2018-07-12 NOTE — Unmapped (Signed)
DERMATOLOGY OUTPATIENT NOTE    Assessment and Plan:    A) Electrodessication and Curettage of BCC x 1 on the R thigh      Discussed with the patient the options of excision versus ED&C.  The patient elected to have ED&C performed today.  She understands that because this is a nodular BCC there may be a higher risk of recurrence after ED&C and that she may need to pursue excision in the future    Lesion site and patient's identity were verified and timeout was performed.      After verbal consent was obtained, the area was prepped with alcohol and anesthetized with 2 ml 2% lidocaine with epinephrine (1:100,000).  Curettage followed by electrodesiccation was performed for 3 cycles.     The area was dressed with petroleum jelly and and a bandage.  The patient tolerated the procedure well and was instructed on wound care and given a handout with this information.      Lesion Size after 1st pass of currette - 11mm    B) Electrodessication and Curettage of SCC in situ x 1 on the L back      Discussed with the patient the options of excision versus ED&C.  The patient elected to have ED&C performed today.  She understands that because this is a nodular BCC there may be a higher risk of recurrence after ED&C and that she may need to pursue excision in the future    Lesion site and patient's identity were verified and timeout was performed.      After verbal consent was obtained, the area was prepped with alcohol and anesthetized with 2 ml 2% lidocaine with epinephrine (1:100,000).  Curettage followed by electrodesiccation was performed for 3 cycles.     The area was dressed with petroleum jelly and and a bandage.  The patient tolerated the procedure well and was instructed on wound care and given a handout with this information.      Lesion Size after 1st pass of currette - 13mm    C) Actinic keratosesx2  - Counseled on pre-malignant potential and treatment options.  - After verbal consent obtained, AK(s) treated with a single 10-second freeze-thaw cycle of cryotherapy. Patient counseled on post-cryotherapy care.   - Patient understands to return for evaluation of any lesions that do not resolve following cryotherapy.    D) History of Non-melanoma Skin Cancer and Actinic skin damage  - Patient has significant NMSC history and actinic damage. We discussed her goals of treatment today and she expressed her desire to avoid excisions and instead elect for ED&Cs whenever possible for lower risk skin cancers (she understands that histologically more aggressive skin cancers may require excision).   - Also discussed that she may benefit from further field treatment in the future (she has used Efudex on her chest in the past). We may discuss this further at next visit    RTC in 1mo    Subjective:     Shelly Cooper is a 77 y.o. yo female who was last seen by me in 05/2018. At that visit, I biopsied a nodular BCC on the R thigh and an SCCis on the L back. She presents today for treatment.     She also complains of new rough scaly spots on her L chest and L 5th finger. THey are asymptomatic,  no alleviating/exacerbating factors, no prior tx    Review of Systems:   baseline health.  ROS x 10 negative.  Objective:     The patient is well-appearing and in no acute distress. Alert and oriented x 3. Mood and affect are pleasant and appropriate. All areas not specifically commented on are within normal limits. A focused examination of the face, neck, upper chest, R thigh, back, and bilateral hands reveals:    1. A well-healed biopsy site of the R thigh  2. Crusted papule at site of previous biopsy on L back  3. Scaly erythematous macules on L 5th digit and L chest  4. Moderate to severe chronic actinic damage  All other areas not specifically commented on are within normal limits

## 2018-08-10 ENCOUNTER — Encounter: Admit: 2018-08-10 | Discharge: 2018-08-11 | Payer: MEDICARE

## 2018-08-10 DIAGNOSIS — M0600 Rheumatoid arthritis without rheumatoid factor, unspecified site: Principal | ICD-10-CM

## 2018-08-10 DIAGNOSIS — Z79899 Other long term (current) drug therapy: Secondary | ICD-10-CM

## 2018-08-10 LAB — CBC W/ AUTO DIFF
BASOPHILS ABSOLUTE COUNT: 0 10*9/L (ref 0.0–0.1)
BASOPHILS RELATIVE PERCENT: 0.6 %
EOSINOPHILS ABSOLUTE COUNT: 0.3 10*9/L (ref 0.0–0.4)
EOSINOPHILS RELATIVE PERCENT: 4.2 %
HEMATOCRIT: 43.2 % (ref 36.0–46.0)
HEMOGLOBIN: 13.2 g/dL (ref 12.0–16.0)
LARGE UNSTAINED CELLS: 2 % (ref 0–4)
LYMPHOCYTES ABSOLUTE COUNT: 1.6 10*9/L (ref 1.5–5.0)
LYMPHOCYTES RELATIVE PERCENT: 24.4 %
MEAN CORPUSCULAR HEMOGLOBIN CONC: 30.7 g/dL — ABNORMAL LOW (ref 31.0–37.0)
MEAN CORPUSCULAR HEMOGLOBIN: 29.2 pg (ref 26.0–34.0)
MEAN CORPUSCULAR VOLUME: 95.2 fL (ref 80.0–100.0)
MONOCYTES ABSOLUTE COUNT: 0.4 10*9/L (ref 0.2–0.8)
MONOCYTES RELATIVE PERCENT: 6.6 %
NEUTROPHILS ABSOLUTE COUNT: 4 10*9/L (ref 2.0–7.5)
PLATELET COUNT: 272 10*9/L (ref 150–440)
RED BLOOD CELL COUNT: 4.54 10*12/L (ref 4.00–5.20)
RED CELL DISTRIBUTION WIDTH: 13 % (ref 12.0–15.0)
WBC ADJUSTED: 6.5 10*9/L (ref 4.5–11.0)

## 2018-08-10 LAB — CREATININE
Creatinine:MCnc:Pt:Ser/Plas:Qn:: 1.03 — ABNORMAL HIGH
EGFR CKD-EPI AA FEMALE: 61 mL/min/{1.73_m2} (ref >=60)
EGFR CKD-EPI NON-AA FEMALE: 53 mL/min/{1.73_m2} — ABNORMAL LOW (ref >=60)

## 2018-08-10 LAB — ERYTHROCYTE SEDIMENTATION RATE: Lab: 5

## 2018-08-10 LAB — BASOPHILS ABSOLUTE COUNT: Lab: 0

## 2018-08-10 LAB — AST (SGOT): Aspartate aminotransferase:CCnc:Pt:Ser/Plas:Qn:: 24

## 2018-08-10 LAB — ALT (SGPT): Alanine aminotransferase:CCnc:Pt:Ser/Plas:Qn:: 12

## 2018-08-10 LAB — C-REACTIVE PROTEIN: C reactive protein:MCnc:Pt:Ser/Plas:Qn:: <5

## 2018-08-10 NOTE — Unmapped (Signed)
No changes.  Call if you flare and need a prednisone taper.

## 2018-08-10 NOTE — Unmapped (Signed)
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  Pt also has a history of non-melanoma skin cancer but was cleared by her dermatologist to start Enbrel, which was started in November 2016 but discontinued in May 2017 due to worsening squamous cell skin lesions. She was continued hydroxychloroquine and started on Sulfasalazine in May 2017 but we have not been able to titrate dose beyond 500 mg po bid due to cough and concerns of possible side effects. She was last seen by myself in November 2017 following a recent shingles flare with evidence for increased LE edema so no medication adjustments made at that time. She called in December 2017 stating that the sulfasalazine was causing swelling in the face and so advised her to stop. She reported 22lb weight loss after stopping the sulfasalazine. She was then seen by Carlus Pavlov PA-C in February 2018 and had recently been diagnosed with pericarditis and started on colchicine. 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 At follow-up in June 2018, reported tapering off colchicine and medrol dose pack for pleuritis and pericarditis. She reported compliance with hydroxychloroquine 200 mg qd. We had discussed starting Actemra/Tocilizumab for RA with pericarditis and pleuritis and patient finally agreed to start following cardiology approval in October 2018. Due to $500 copay cost following first infusion, patient declined continuing with tocilizumab infusions. She was not interested in pursuing [place] injections with tocilizumab or other therapy. She then was started on tofacitinib, but one week after starting she reported abdominal pain and h/o diverticulitis so this was discontinued.      HPI: Shelly Cooper is a 77 y.o. 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  She was last evaluated in Rheumatology clinic in September 2019. She was noted to have continued active RA with moderate disease activity as noted by CDAI score 21 and was started on abatacept. Unfortunately there was an issue with getting the medication and she was not able to start this until October 17. She was having ongoing symptoms and so messaged the clinic about options to help with this. Dr. Marshall Cork suggested low dose prednisone with a taper. She was unsure how much to take and so took 20 mg x 4 days, 10 mg x 4 days and then 5 mg x 4 days. This helped a little, but not much.     She reports tolerating Orencia well besides possibly a mild worsening of her chronic cough without dyspnea. However, her RA symptoms have not improved and are stably bad. She reports ongoing pain and stiffness in her hands (mainly MCPs), wrists, knees and shoulders. She has swelling in her MCPs in the morning that resolves throughout the day. She continues on hydroxychloroquine 200 mg daily. She has not had any recent infections.     She had a mechanical fall 2 weeks ago and fell on face and outstretched right hand. She has resultant right wrist pain on the dorsal side. She was not evaluated after the fall.     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     ROS??:Attests to the above otherwise review of all other system is  negative.??  ??  Past Medical and Surgical History:  ??  Patient Active Problem List    Diagnosis Date Noted   ??? Chest pain 09/15/2016   ??? Seronegative rheumatoid arthritis (CMS-HCC) 03/14/2015   ??? De Quervain's tenosynovitis, bilateral 07/17/2014   ??? Nonspecific immunological findings 03/14/2014   ??? Edema 01/03/2014   ??? Primary localized osteoarthrosis, lower leg 07/01/2012   ??? Derangement of medial meniscus 06/23/2012   ??? Other specified cardiac dysrhythmias 03/17/2012   ??? Coronary atherosclerosis (RAF-HCC) 01/29/2012   ??? Essential hypertension 01/29/2012   ??? 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 HYSTERECTOMY  1978    fibroids   ??? NASAL POLYP EXCISION     ??? SKIN BIOPSY     ??? [facility] HISTORICAL RESULT)  2001    post-MI   ??? TRIGGER FINGER RELEASE       Allergies:   ??  Allergies   Allergen Reactions   ??? Aspirin Swelling     Other reaction(s): Swelling  Facial swelling  Other reaction(s): UNKNOWN   ??? Lyrica  [Pregabalin] Swelling     Other reaction(s): Swelling  Other reaction(s): SWELLING     Current Outpatient Medications:  ??  Current Outpatient Medications:   ???  abatacept (ORENCIA CLICKJECT) 125 mg/mL AtIn, Inject the contents of 1 pen (125 mg) under the skin once a week., Disp: 12 mL, Rfl: 3  ???  cetirizine (ZYRTEC) 10 MG tablet, Take 10 mg by mouth daily as needed., Disp: , Rfl:   ???  clopidogrel (PLAVIX) 75 mg tablet, Take 75 mg by mouth daily., Disp: , Rfl:   ???  hydroxychloroquine (PLAQUENIL) 200 mg tablet, Take 1 tablet (200 mg total) by mouth daily., Disp: 90 tablet, Rfl: 3  ???  lansoprazole (PREVACID) 30 MG capsule, Take 1 capsule by mouth once daily., Disp: , Rfl:   ???  losartan (COZAAR) 100 MG tablet, Take 100 mg by mouth., Disp: , Rfl:   ???  predniSONE (DELTASONE) 10 MG tablet, Take 1 tablet (10 mg total) by mouth daily., Disp: 90 tablet, Rfl: 3  ???  torsemide (DEMADEX) 20 MG tablet, Take 20 mg by mouth daily as needed. , Disp: , Rfl:   ???  VIT C/VIT E AC/LUT/COPPER/ZINC (PRESERVISION LUTEIN ORAL), Take 1 tablet by mouth daily., Disp: , Rfl:       Immunization History   Administered Date(s) Administered   ??? INFLUENZA TIV (TRI) 96MO+ W/ PRESERV (IM) 06/14/2014   ??? INFLUENZA TIV (TRI) PF (IM) 06/15/2007, 06/05/2008, 06/10/2010, 06/30/2011   ??? Influenza Vaccine Quad (IIV4 PF) 63mo+ injectable 05/17/2015, 06/10/2017, 05/19/2018   ??? Influenza Virus Vaccine, unspecified formulation 06/24/2014, 06/02/2016   ??? PNEUMOCOCCAL POLYSACCHARIDE 23 10/06/2016   ??? Pneumococcal Conjugate 13-Valent 04/03/2014     ????  PHYSICAL EXAM??  Vital signs: BP 145/70 (BP Site: R Arm, BP Position: Sitting, BP Cuff Size: Large)  - Pulse 73  - Temp 36.3 ??C (Oral)  - Ht 162.6 cm (5' 4.02)  - Wt 88.1 kg (194 lb 3.2 oz)  - BMI 33.32 kg/m?? Body mass index is 33.32 kg/m??.  Gen: Well-developed, well-nourished adult in no apparent distress.  Pleasant and cooperative. AOx4.?  HEENT: PERRLA, EOMI, MMM, oropharynx in pink without ulcerations or exudates visualized.??  No cervical lymphadenopathy.   Lungs: Broad chest excursion with good air movement. CTAB without wheezing/rhonchi/rales. ????  CV: RRR, Normal S1/S2, No murmurs/rubs/gallops heard.  PV: Warm, 2+ radial pulses, no C/C/E.  Neuro: Good comprehension/cognition.  Gait intact.  Comprehensive Musculoskeletal Examination:????  ?? Jaw, neck without limited ROM.???? Some pain with left lateral bending.   ?? Shoulders, elbows, wrists, hands, fingers: Squaring at [facility] bilaterally.  1-3 right MCPs swollen (improved from prior) but not tender. 2-3 MCPs swollen but not tender.   ??  Unable to make a tight fist (right worse) and grip strength reduced bilaterally. Right hand grip strength 25->60 mmHg and left hand grip strength 25->60 mmHg  ?? Bilateral shoulders with full ROM but stiffness and pain on active and passive ROM.  Elbows wnl. Tender in right wrist. Full ROM in bilateral wrists and left wrist non-tender.   ?? Bilateral hips with slightly limited external rotation.  Skin: Scattered and extensive erythematous scaly lesions on bilateral lower extremities, likely related to the squamous cell. Trace LE edema                     CDAI interpretation:  0.0-2.8 Remission   2.9-10.0 Low disease activity   10.1-22.0 Moderate disease activity   22.1-76.0 High disease activity     Results:   Recent Results (from the past 2016 hour(s))   Dermatopathology Order    Collection Time: 05/18/18  4:48 PM   Result Value Ref Range    Case Report       Surgical Pathology Report                         Case: [id number]                                 Authorizing Provider:  Marylene Land, MD  Collected:           05/18/2018 1648              Ordering Location:     [facility]            Received:            05/19/2018 0981                                     [place]                                                                 Pathologist:           Abbie Sons, MD                                                         Specimens:   A) - Skin, Left back, shave  B) - Skin, Right thigh, shave                                                              Final Diagnosis       A:  Left back, shave  -Squamous cell carcinoma in situ, 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    B:  Right thigh, shave  -Basal cell carcinoma, nodular growth pattern, extending to peripheral edge and base.      Clinical History       A: 16 mm; BCC vs SCC vs AK vs KA vs SK   B:  1 cm; BCC vs SCC vs AK vs KA vs SK       Gross Description       A:  The specimen was received labeled with the patient's name and measured 14 x 9 x 6 mm, serially sectioned, NTR.  B:  The specimen was received labeled with the patient's name and measured 8 x 8 x 2 mm, serially sectioned, NTR.      Microscopic Description       Light microscopic examination is performed by Dr. Abbie Sons.      EMBEDDED IMAGES     Comprehensive Metabolic Panel    Collection Time: 05/19/18 11:34 AM   Result Value Ref Range    Sodium 140 135 - 145 mmol/L    Potassium 4.3 3.5 - 5.0 mmol/L    Chloride 105 98 - 107 mmol/L    CO2 28.0 22.0 - 30.0 mmol/L    BUN 24 (H) 7 - 21 mg/dL    Creatinine 1.61 (H) 0.60 - 1.00 mg/dL    BUN/Creatinine Ratio 23     EGFR CKD-EPI Non-African American, Female 52 (L) >=60 mL/min/1.11m2 EGFR CKD-EPI African American, Female 60 >=60 mL/min/1.62m2    Glucose 92 65 - 179 mg/dL    Calcium 9.1 8.5 - 09.6 mg/dL    Albumin 3.9 3.5 - 5.0 g/dL    Total Protein 6.5 6.5 - 8.3 g/dL    Total Bilirubin 0.3 0.0 - 1.2 mg/dL    AST 24 17 - 47 U/L    ALT 18 13 - 69 U/L    Alkaline Phosphatase 87 38 - 126 U/L    Anion Gap 7 (L) 9 - 15 mmol/L   CRP  C-Reactive Protein    Collection Time: 05/19/18 11:34 AM   Result Value Ref Range    CRP <5.0 <10.0 mg/L   ESR Sed rate    Collection Time: 05/19/18 11:34 AM   Result Value Ref Range    Sed Rate 4 0 - 30 mm/h   Serum Protein Electrophoresis and Immunofixation    Collection Time: 05/19/18 11:34 AM   Result Value Ref Range    Total Protein 6.5 6.5 - 8.3 g/dL    ALBUMIN (SPE) 3.9 3.5 - 5.0 g/dL    ALPHA-1 GLOBULIN 0.3 0.2 - 0.5 g/dL    ALPHA-2 GLOBULIN 0.8 0.5 - 1.1 g/dL    BETA-1 GLOBULIN 0.4 0.3 - 0.6 g/dL    BETA-2 GLOBULIN 0.4 0.2 - 0.6 g/dL    GAMMAGLOBULIN 0.7 0.5 - 1.5 g/dL    SPE Interpretation      Immunofixation Electrophoresis, Serum     Light Chains, serum  quant    Collection Time: 05/19/18 11:34 AM   Result Value Ref Range    Kappa Free, Serum 2.25 (H) 0.33 - 1.94 mg/dL    Lambda Free, Serum 1.61 0.57 - 2.63 mg/dL    K/L FLC Ratio 0.96 0.45 - 1.65   CBC w/ Differential    Collection Time: 05/19/18 11:34 AM   Result Value Ref Range    WBC 6.0 4.5 - 11.0 10*9/L    RBC 4.41 4.00 - 5.20 10*12/L    HGB 12.7 12.0 - 16.0 g/dL    HCT 40.9 81.1 - 91.4 %    MCV 91.5 80.0 - 100.0 fL    MCH 28.9 26.0 - 34.0 pg    MCHC 31.5 31.0 - 37.0 g/dL    RDW 78.2 95.6 - 21.3 %    MPV 6.8 (L) 7.0 - 10.0 fL    Platelet 264 150 - 440 10*9/L    Neutrophils % 62.0 %    Lymphocytes % 22.6 %    Monocytes % 5.9 %    Eosinophils % 6.3 %    Basophils % 0.7 %    Absolute Neutrophils 3.7 2.0 - 7.5 10*9/L    Absolute Lymphocytes 1.4 (L) 1.5 - 5.0 10*9/L    Absolute Monocytes 0.4 0.2 - 0.8 10*9/L    Absolute Eosinophils 0.4 0.0 - 0.4 10*9/L    Absolute Basophils 0.0 0.0 - 0.1 10*9/L    Large Unstained Cells 3 0 - 4 %   Creatinine    Collection Time: 08/10/18 12:56 PM   Result Value Ref Range    Creatinine 1.03 (H) 0.60 - 1.00 mg/dL    EGFR CKD-EPI Non-African American, Female 53 (L) >=60 mL/min/1.49m2    EGFR CKD-EPI African American, Female 55 >=60 mL/min/1.12m2   AST    Collection Time: 08/10/18 12:56 PM   Result Value Ref Range    AST 24 17 - 47 U/L   ALT    Collection Time: 08/10/18 12:56 PM   Result Value Ref Range    ALT 12 <35 U/L   C-reactive protein    Collection Time: 08/10/18 12:56 PM   Result Value Ref Range    CRP <5.0 <10.0 mg/L   Sedimentation Rate    Collection Time: 08/10/18 12:56 PM   Result Value Ref Range    Sed Rate 5 0 - 30 mm/h   CBC w/ Differential    Collection Time: 08/10/18 12:56 PM   Result Value Ref Range    WBC 6.5 4.5 - 11.0 10*9/L    RBC 4.54 4.00 - 5.20 10*12/L    HGB 13.2 12.0 - 16.0 g/dL    HCT 08.6 57.8 - 46.9 %    MCV 95.2 80.0 - 100.0 fL    MCH 29.2 26.0 - 34.0 pg    MCHC 30.7 (L) 31.0 - 37.0 g/dL    RDW 62.9 52.8 - 41.3 %    MPV 7.1 7.0 - 10.0 fL    Platelet 272 150 - 440 10*9/L    Neutrophils % 62.0 %    Lymphocytes % 24.4 %    Monocytes % 6.6 %    Eosinophils % 4.2 %    Basophils % 0.6 %    Absolute Neutrophils 4.0 2.0 - 7.5 10*9/L    Absolute Lymphocytes 1.6 1.5 - 5.0 10*9/L    Absolute Monocytes 0.4 0.2 - 0.8 10*9/L    Absolute Eosinophils 0.3 0.0 - 0.4 10*9/L    Absolute Basophils 0.0 0.0 -  0.1 10*9/L    Large Unstained Cells 2 0 - 4 %    Hypochromasia Moderate (A) Not Present       ??GENERAL SUMMARY/IMPRESSION AND RECOMMENDATIONS: ??  ???In summary, the patient is a 77 y.o. female with seronegative RA and non-melanoma skin cancer s/p pericarditis and pleuritis in 2018 now on hydroxychloroquine 200 mg qd monotherapy. Cannot take MTX and Leflunomide due to concerns for ILD. With sulfasalazine, facial swelling. Enbrel with worsening skin cancer.  Actemra infusions were too costly and patient reluctant to try self-injectable version.  Had one dose of tofacitinib complicated by abdominal pain and patient after starting revealed h/o diverticular disease. Ophthalmologist does not wish for patient to try higher dose of hydroxychloroquine to reach 5 mg/kg dosing level due to underlying macular degeneration. She was started on Orencia after her last visit but did not start treatment until October 17.  She does continue to have active RA, but her exam is overall mildly improved.  She is tolerating Orencia well and will likely need more time to see its full effect. If [name] is ineffective, will consider another anti-TNF agent as she did have good response to etanercept in the past. Labs today (CBC w/ differential, creatinine, AST, ALT, ESR and CRP).    Follow-up in three months. The patient indicates understanding of these issues and agrees to the plan as outlined above.  Contact information provided for any concerns or questions in the interim.        Diagnosis ICD-10-CM Associated Orders   1. Seronegative rheumatoid arthritis (CMS-HCC) M06.00 CBC w/ Differential     Creatinine     AST     ALT     C-reactive protein     Sedimentation Rate     CBC w/ Differential     Creatinine     AST     ALT     C-reactive protein     Sedimentation Rate     CBC w/ Differential   2. High risk medication use Z79.899 CBC w/ Differential     Creatinine     AST     ALT     C-reactive protein     Sedimentation Rate     CBC w/ Differential     Creatinine     AST     ALT     C-reactive protein     Sedimentation Rate     CBC w/ Differential     Patient Instructions   No changes.  Call if you flare and need a prednisone taper.    The patient was seen and staffed with Dr. Scarlette Calico.

## 2018-08-19 NOTE — Unmapped (Signed)
Called pt to gather more information. No answer. Voicemail left. Awaiting callback.

## 2018-10-11 ENCOUNTER — Encounter: Admit: 2018-10-11 | Discharge: 2018-10-12 | Payer: MEDICARE | Attending: Dermatology | Primary: Dermatology

## 2018-10-11 DIAGNOSIS — L570 Actinic keratosis: Principal | ICD-10-CM

## 2018-10-11 DIAGNOSIS — Z85828 Personal history of other malignant neoplasm of skin: Secondary | ICD-10-CM

## 2018-10-11 MED ORDER — FLUOROURACIL 5 % TOPICAL CREAM
0 refills | 0 days | Status: CP
Start: 2018-10-11 — End: ?

## 2018-10-11 NOTE — Unmapped (Addendum)
Cryosurgery  Cryosurgery (???freezing???) uses liquid nitrogen to destroy certain types of skin lesions. Lowering the temperature of the lesion in a small area surrounding skin destroys the lesion. Immediately following cryosurgery, you will notice redness and swelling of the treatment area. Blistering or weeping may occur, lasting approximately one week which will then be followed by crusting. Most areas will heal completely in 10 to 14 days.    Wash the treated areas daily. Allow soap and water to run over the areas, but do not scrub. Should a scab or crust form, allow it to fall off on its own. Do not remove or pick at it. Application of an ointment  and a bandage may make you feel more comfortable, but it is not necessary. Some people develop an allergy to Neosporin, so we recommend that Vaseline or  Aquaphor be used.    The cryotherapy site will be more sensitive than your surrounding skin. Keep it covered, and remember to apply sunscreen every day to all your sun exposed skin. A scar may remain which is lighter or pinker than your normal skin. Your body will continue to improve your scar for up to one year; however a light-colored scar may remain.    Infection following cryotherapy is rare. However if you are worried about the appearance of the treated area, contact your doctor. We have a physician on call at all times. If you have any concerns about the site, please call our clinic at [phone]    Patient Education        adalimumab  Pronunciation:  AY da LIM ue mab  Brand:  Humira  What is the most important information I should know about adalimumab?  This medicine affects your immune system. You may get infections more easily, even serious or fatal infections.  Before or during treatment with adalimumab, tell your doctor if you have signs of infection such as fever, chills, aches, tiredness, cough, skin sores, diarrhea, or burning when you urinate.  What is adalimumab?  Adalimumab reduces the effects of a substance in the body that can cause inflammation.  Adalimumab is used to treat many inflammatory conditions in adults, such as ulcerative colitis, rheumatoid arthritis, psoriatic arthritis, ankylosing spondylitis, plaque psoriasis, and a skin condition called hidradenitis suppurativa.  Adalimumab is also used in adults and children to treat Crohn's disease, juvenile idiopathic arthritis, or uveitis.  Adalimumab may also be used for purposes not listed in this medication guide.  What should I discuss with my healthcare provider before using adalimumab?  You should not use this medicine if you are allergic to adalimumab.  Before you start using adalimumab, tell your doctor if you have signs of infection --fever, chills, sweats, muscle aches, tiredness, cough, bloody mucus, skin sores, diarrhea, burning when you urinate, or feeling constantly tired.  Adalimumab should not be given to a child younger than 94 years old (or 50 years old if treating Crohn's disease).  Children using adalimumab should be current on all childhood immunizations before starting treatment.  Tell your doctor if you have ever had:  ?? tuberculosis (or if anyone in your household has tuberculosis);  ?? cancer;  ?? hepatitis B (adalimumab can cause hepatitis B to come back or get worse);  ?? diabetes;  ?? congestive heart failure;  ?? any numbness or tingling, or a nerve-muscle disorder such as multiple sclerosis or Guillain-Barre syndrome;  ?? an allergy to latex rubber;  ?? if you are scheduled to have major surgery; or  ??  if you have recently received or are scheduled to receive any vaccine.  Tell your doctor where you live and if you have recently traveled or plan to travel. You may be exposed to infections that are common to certain areas of the world.  Adalimumab may cause a rare type of lymphoma (cancer) of the liver, spleen, and bone marrow that can be fatal. This has occurred mainly in teenagers and young men with Crohn's disease or ulcerative colitis. However, anyone with an inflammatory autoimmune disorder may have a higher risk of lymphoma. Talk with your doctor about your own risk.  It is not known whether this medicine will harm an unborn baby. Tell your doctor if you are pregnant. Make sure any doctor caring for your newborn baby knows if you used adalimumab while you were pregnant.  It may not be safe to breast-feed a baby while you are using this medicine. Ask your doctor about any risks.  How should I use adalimumab?  Follow all directions on your prescription label and read all medication guides or instruction sheets. Use the medicine exactly as directed.  Adalimumab is injected under the skin. A healthcare provider will teach you how to properly use this medicine by yourself.  Do not start using adalimumab if you have any signs of an infection. Call your doctor for instructions.  Read and carefully follow any instruction sheet provided with your medicine. Do not use adalimumab if you do not understand the instructions for proper use. Ask your doctor or pharmacist if you have any questions.  The dose schedule for adalimumab is highly variable and depends on the condition you are treating. Follow your doctor's dosing instructions very carefully.  Prepare your injection only when you are ready to give it. Do not use if the medicine looks cloudy or has particles in it. Call your pharmacist for new medicine.  Adalimumab affects your immune system. You may get infections more easily, even serious or fatal infections. Your doctor will need to examine you on a regular basis.  Store this medicine in its original carton in a refrigerator. Do not freeze. If you are traveling, carefully follow all patient instructions for storing your medicine during travel.  Throw away any adalimumab that has become frozen.  Use a needle and syringe only once and then place them in a puncture-proof sharps container. Follow state or local laws about how to dispose of this container. Keep it out of the reach of children and pets.  What happens if I miss a dose?  Use the medicine as soon as you remember, and then go back to your regular injection schedule. Do not use extra medicine to make up the missed dose.  What happens if I overdose?  Seek emergency medical attention or call the Poison Help line at [phone].  What should I avoid while using adalimumab?  Do not inject adalimumab into skin that is bruised, red, tender, or hard.  Avoid being near people who are sick or have infections. Tell your doctor at once if you develop signs of infection.  Do not receive a live vaccine while using adalimumab. The vaccine may not work as well during this time, and may not fully protect you from disease. Live vaccines include measles, mumps, rubella (MMR), polio, rotavirus, typhoid, yellow fever, varicella (chickenpox), or zoster (shingles).  What are the possible side effects of adalimumab?  Get emergency medical help if you have any of these signs of an allergic reaction: hives; difficulty breathing; swelling  of your face, lips, tongue, or throat.  Stop using adalimumab and call your doctor right away if you have any symptoms of lymphoma:  ?? fever, swollen glands, night sweats, general feeling of illness;  ?? joint and muscle pain, skin rash, easy bruising or bleeding;  ?? pale skin, feeling light-headed or short of breath, cold hands and feet;  ?? pain in your upper stomach that may spread to your shoulder; or  ?? loss of appetite, feeling full after eating only a small amount, weight loss.  Also call your doctor at once if you have:  ?? new or worsening psoriasis (raised, silvery flaking of the skin);  ?? liver problems --fever, body aches, tiredness, stomach pain, right-sided upper stomach pain, vomiting, loss of appetite, dark urine, clay-colored stools, jaundice (yellowing of the skin or eyes);  ?? lupus-like syndrome --joint pain or swelling, chest pain, shortness of breath, patchy skin color that worsens in sunlight;  ?? nerve problems --numbness, tingling, dizziness, vision problems, weakness in your arms or legs; or  ?? signs of tuberculosis --fever with ongoing cough, weight loss (fat or muscle).  Older adults may be more likely to develop infections or cancer while using adalimumab.  Common side effects may include:  ?? headache;  ?? cold symptoms such as stuffy nose, sinus pain, sneezing, sore throat;  ?? rash; or  ?? redness, bruising, itching, or swelling where the injection was given.  This is not a complete list of side effects and others may occur. Call your doctor for medical advice about side effects. You may report side effects to FDA at 1-800-FDA-1088.  What other drugs will affect adalimumab?  Some drugs should not be used together with adalimumab. Tell your doctor about all medicines you use, and those you start or stop using during your treatment with adalimumab, especially:  ?? abatacept, etanercept;  ?? anakinra;  ?? azathioprine, mercaptopurine; or  ?? certolizumab, golimumab, infliximab, rituximab.  This list is not complete. Other drugs may interact with adalimumab, including prescription and over-the-counter medicines, vitamins, and herbal products. Not all possible interactions are listed in this medication guide.  Where can I get more information?  Your pharmacist can provide more information about adalimumab.  Remember, keep this and all other medicines out of the reach of children, never share your medicines with others, and use this medication only for the indication prescribed.  Every effort has been made to ensure that the information provided by [employer] ('[employer]') is accurate, up-to-date, and complete, but no guarantee is made to that effect. Drug information contained herein may be time sensitive. [employer] information has been compiled for use by healthcare practitioners and consumers in the [place] and therefore [employer] does not warrant that uses outside of the [place] are appropriate, unless specifically indicated otherwise. [employer]'s drug information does not endorse drugs, diagnose patients or recommend therapy. [employer]'s drug information is an [employer] corporate to assist licensed healthcare practitioners in caring for their patients and/or to serve consumers viewing this service as a supplement to, and not a substitute for, the expertise, skill, knowledge and judgment of healthcare practitioners. The absence of a warning for a given drug or drug combination in no way should be construed to indicate that the drug or drug combination is safe, effective or appropriate for any given patient. [employer] does not assume any responsibility for any aspect of healthcare administered with the aid of information [employer] provides. The information contained herein is not intended to cover all  possible uses, directions, precautions, warnings, drug interactions, allergic reactions, or adverse effects. If you have questions about the drugs you are taking, check with your doctor, nurse or pharmacist.  Copyright [phone] [employer] Version: 13.03. Revision date: 06/28/2017.  Care instructions adapted under license by [facility]. If you have questions about a medical condition or this instruction, always ask your healthcare professional. [employer] disclaims any warranty or liability for your use of this information.

## 2018-10-11 NOTE — Unmapped (Signed)
DERMATOLOGY OUTPATIENT NOTE    Assessment and Plan:    Extensive actinic keratoses  - Counseled on pre-malignant potential and treatment options.  - After verbal consent obtained, AK(s) x > 15treated with a single 10-second freeze-thaw cycle of cryotherapy. Patient counseled on post-cryotherapy care.   - Patient understands to return for evaluation of any lesions that do not resolve following cryotherapy.  - Prescribed efudex cream to apply bid to most heavily sun damaged areas including lower legs and chest for 2-3weeks as tolerated  Prescription provided as above and patient counseled on appropriate administration techniques, potential side effects, and expectations.     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 We have previously discussed her goals of treatmentand she has expressed her desire to avoid excisions and instead elect for ED&Cs whenever possible for lower risk skin cancers (she understands that histologically more aggressive skin cancers may require excision).   - She is also amenable to ongoing field tx as discussed above    RTC in 2-31mo    Subjective:     Shelly Cooper is a 78 y.o. yo female who was last seen by me in 07/2018. She has a hx of numerous NMSCs and extensive sun damage. Complains today of many scaly spots on face, chest, arms and legs. THey are asymptomatic,  no alleviating/exacerbating factors, no prior tx    Review of Systems:   baseline health.  ROS x 10 negative.      Objective:     The patient is well-appearing and in no acute distress. Alert and oriented x 3. Mood and affect are pleasant and appropriate. All areas not specifically commented on are within normal limits. A focused examination of the scalp, face, neck, back, distal bilateral arms, distal bilateral legs, upper chest (declines full skin exam) reveals:    - well-healed scars at sites of prior NMSC tx  - numerous scaly erythematous macules and papuels on face, bilateral arms and legs and upper chest  - Moderate to severe chronic actinic damage  All other areas not specifically commented on are within normal limits

## 2018-11-17 ENCOUNTER — Encounter: Admit: 2018-11-17 | Discharge: 2018-11-18 | Payer: MEDICARE

## 2018-11-17 DIAGNOSIS — K219 Gastro-esophageal reflux disease without esophagitis: Principal | ICD-10-CM

## 2018-11-17 DIAGNOSIS — I10 Essential (primary) hypertension: Principal | ICD-10-CM

## 2018-11-17 DIAGNOSIS — C4491 Basal cell carcinoma of skin, unspecified: Principal | ICD-10-CM

## 2018-11-17 DIAGNOSIS — M6530 Trigger finger, unspecified finger: Principal | ICD-10-CM

## 2018-11-17 DIAGNOSIS — M069 Rheumatoid arthritis, unspecified: Principal | ICD-10-CM

## 2018-11-17 DIAGNOSIS — I219 Acute myocardial infarction, unspecified: Principal | ICD-10-CM

## 2018-11-17 DIAGNOSIS — C4492 Squamous cell carcinoma of skin, unspecified: Principal | ICD-10-CM

## 2018-11-17 MED ORDER — HYDROXYCHLOROQUINE 200 MG TABLET
ORAL_TABLET | Freq: Every day | ORAL | 3 refills | 0 days | Status: CP
Start: 2018-11-17 — End: ?

## 2018-11-17 NOTE — Unmapped (Signed)
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  Pt also has a history of non-melanoma skin cancer but was cleared by her dermatologist to start Enbrel, which was started in November 2016 but discontinued in May 2017 due to worsening squamous cell skin lesions. She was continued hydroxychloroquine and started on Sulfasalazine in May 2017 but we have not been able to titrate dose beyond 500 mg po bid due to cough and concerns of possible side effects. She was last seen by myself in November 2017 following a recent shingles flare with evidence for increased LE edema so no medication adjustments made at that time. She called in December 2017 stating that the sulfasalazine was causing swelling in the face and so advised her to stop. She reported 22lb weight loss after stopping the sulfasalazine. She was then seen by Carlus Pavlov PA-C in February 2018 and had recently been diagnosed with pericarditis and started on colchicine. 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 At follow-up in June 2018, reported tapering off colchicine and medrol dose pack for pleuritis and pericarditis. She reported compliance with hydroxychloroquine 200 mg qd. We had discussed starting Actemra/Tocilizumab for RA with pericarditis and pleuritis and patient finally agreed to start following cardiology approval in October 2018. Due to $500 copay cost following first infusion, patient declined continuing with tocilizumab infusions. She was not interested in pursuing [place] injections with tocilizumab or other therapy. She then was started on tofacitinib, but one week after starting she reported abdominal pain and h/o diverticulitis so this was discontinued.  Started Orencia in October 2019.    HPI: [name] is a 78 y.o. 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 She agreed to Orencia at follow-up in September 2019 but had delay in starting till late October 2019. At follow-up in December 2019 reported ongoing symptoms but advised her to take more time with medication. She is also taking hydroxychloroquine 200 mg po qd. She returns today for follow-up.    Patient reports that she has been sick since December 2019 following her visit here. She was evaluated for flu and URI. She reports BNP increased so was placed on higher diuretics. She did have lower extremity edema. Since being on diuretics swelling improved.  Still having breathing issues. So was tried on a PPI for GERD. PCP is questioning if Orencia contributing to her breathing issues. She is feeling better of late. She stopped taking the hydroxychloroquine 200 mg daily a month ago. Ophthalmologist did not want higher dosing due to her eyes. We discussed that there may be some benefit to continue it during COVID-19. Her joints have never been better so she does not want to stop the Orencia. No significant pain in joints. She does have morning stiffness in her knees for several minutes. Hands have been good in the morning. No aggravation to her skin cancer.    ROS??:Attests to the above otherwise review of all other system is negative.??  ??  Past Medical and Surgical History:  ??  Patient Active Problem List    Diagnosis Date Noted   ??? Chest pain 09/15/2016   ??? Seronegative rheumatoid  arthritis (CMS-HCC) 03/14/2015   ??? De Quervain's tenosynovitis, bilateral 07/17/2014   ??? Nonspecific immunological findings 03/14/2014   ??? Edema 01/03/2014   ??? Primary localized osteoarthrosis, lower leg 07/01/2012   ??? Derangement of medial meniscus 06/23/2012   ??? Other specified cardiac dysrhythmias 03/17/2012   ??? Coronary atherosclerosis (RAF-HCC) 01/29/2012   ??? Essential hypertension 01/29/2012   ??? 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 HYSTERECTOMY  1978    fibroids   ??? NASAL POLYP EXCISION     ??? SKIN BIOPSY     ??? STENTS [facility] HISTORICAL RESULT)  2001    post-MI   ??? TRIGGER FINGER RELEASE       Allergies:   ??  Allergies   Allergen Reactions   ??? Aspirin Swelling     Other reaction(s): Swelling  Facial swelling  Other reaction(s): UNKNOWN   ??? Lyrica  [Pregabalin] Swelling     Other reaction(s): Swelling  Other reaction(s): SWELLING     Current Outpatient Medications:  ??  Current Outpatient Medications:   ???  abatacept (ORENCIA CLICKJECT) 125 mg/mL AtIn, Inject the contents of 1 pen (125 mg) under the skin once a week., Disp: 12 mL, Rfl: 3  ???  clopidogrel (PLAVIX) 75 mg tablet, Take 75 mg by mouth daily., Disp: , Rfl:   ???  fluorouracil (EFUDEX) 5 % cream, Apply 2x daily to sun damaged areas for 3 weeks. Stop early if excessive inflammation occurs, Disp: 40 g, Rfl: 0  ???  losartan (COZAAR) 100 MG tablet, Take 100 mg by mouth., Disp: , Rfl:   ???  pantoprazole (PROTONIX) 40 MG tablet, Take by mouth Two (2) times a day., Disp: , Rfl:   ???  torsemide (DEMADEX) 20 MG tablet, Take 20 mg by mouth daily as needed. , Disp: , Rfl:   ???  hydroxychloroquine (PLAQUENIL) 200 mg tablet, Take 1 tablet (200 mg total) by mouth daily. (Patient not taking: Reported on 11/17/2018), Disp: 90 tablet, Rfl: 3  ???  lansoprazole (PREVACID) 30 MG capsule, Take 1 capsule by mouth once daily., Disp: , Rfl:   ???  predniSONE (DELTASONE) 10 MG tablet, Take 1 tablet (10 mg total) by mouth daily. (Patient not taking: Reported on 11/17/2018), Disp: 90 tablet, Rfl: 3      Immunization History   Administered Date(s) Administered   ??? INFLUENZA TIV (TRI) 22MO+ W/ PRESERV (IM) 06/14/2014   ??? INFLUENZA TIV (TRI) PF (IM) 06/15/2007, 06/05/2008, 06/10/2010, 06/30/2011 ??? Influenza Vaccine Quad (IIV4 PF) 35mo+ injectable 05/17/2015, 06/10/2017, 05/19/2018   ??? Influenza Virus Vaccine, unspecified formulation 06/24/2014, 06/02/2016   ??? PNEUMOCOCCAL POLYSACCHARIDE 23 10/06/2016   ??? Pneumococcal Conjugate 13-Valent 04/03/2014     ????  PHYSICAL EXAM??  Vital signs: BP 131/105 (BP Site: L Arm, BP Position: Sitting, BP Cuff Size: Large)  - Pulse 82  - Temp 36.8 ??C (98.3 ??F) (Oral)  - Ht 162.6 cm (5' 4.02)  - Wt 85.5 kg (188 lb 6.4 oz)  - BMI 32.32 kg/m?? Body mass index is 32.32 kg/m??.  Gen: Well-developed, well-nourished adult in no apparent distress.  Pleasant and cooperative. AOx4.?  HEENT: PERRLA, EOMI, MMM, oropharynx in pink without ulcerations or exudates visualized.??  No cervical lymphadenopathy.   Lungs: Broad chest excursion with good air movement. There is some coarse breath sounds at bases. ????  CV: RRR, Normal S1/S2, No murmurs/rubs/gallops heard.   PV: Warm, 2+ radial pulses,  no C/C/E.  Neuro: Good comprehension/cognition.  Gait intact.  Comprehensive Musculoskeletal Examination:????  ?? Jaw, neck without limited ROM.???? Some pain with left lateral bending.   ?? Shoulders, elbows, wrists, hands, fingers: Squaring at [facility] bilaterally.  1-3 right MCPs swollen (improved from prior) but not tender. 2-3 MCPs swollen but not tender. Wrists, elbows, and shoulders wnl  ?? Feet/toes wnl. Ankles and knees wnl. Crepitus in knees  Skin: Scattered and extensive erythematous scaly lesions on bilateral lower extremities, likely related to the squamous cell.     Results: recently completed labs at [facility] reviewed in Care Everywhere on November 01, 2018    ??GENERAL SUMMARY/IMPRESSION AND RECOMMENDATIONS: ??  ???In summary, the patient is a 78 y.o. female with seronegative RA and non-melanoma skin cancer s/p pericarditis and pleuritis in 2018 now on hydroxychloroquine 200 mg qd and abatacept weekly injection. She self discontinued hydroxychloroquine in the interim and advised her to resume. Her joint exam is improved compared to prior so would like to continue abatacept and she would like to continue as well. Unclear if breathing issues related to abatacept. While there have been reports of worsening COPD and asthma, abatacept has also been reported to possibly benefit RA-ILD. She has noted significant improvement in breathing with increased diuresis so pulmonary symptoms may be more likely related to CHF. Overall, we will continue current therapeutic plan. Labs reviewed through [facility]. Advised patient on social distancing including family members and good hygiene. Up to date with immunizations.  Follow-up in six months or sooner as needed. The patient indicates understanding of these issues and agrees to the plan as outlined above.  Contact information provided for any concerns or questions in the interim.        Diagnosis ICD-10-CM Associated Orders   1. Rheumatoid arthritis, involving unspecified site, unspecified rheumatoid factor presence (CMS-HCC) M06.9 hydroxychloroquine (PLAQUENIL) 200 mg tablet     Patient Instructions   Recommend you stay on Orencia weekly injections for now and resume the hydroxychloroquine (plaquenil) 200 mg daily.     Kayleah Appleyard C. Scarlette Calico, MD, PhD  Assistant Professor of Medicine  Department of Medicine/Division of Rheumatology  [facility]

## 2018-11-17 NOTE — Unmapped (Signed)
Recommend you stay on Orencia weekly injections for now and resume the hydroxychloroquine (plaquenil) 200 mg daily.

## 2019-03-13 ENCOUNTER — Encounter: Admit: 2019-03-13 | Discharge: 2019-03-14 | Payer: MEDICARE

## 2019-03-13 DIAGNOSIS — L578 Other skin changes due to chronic exposure to nonionizing radiation: Secondary | ICD-10-CM

## 2019-03-13 DIAGNOSIS — D485 Neoplasm of uncertain behavior of skin: Principal | ICD-10-CM

## 2019-03-13 DIAGNOSIS — L570 Actinic keratosis: Secondary | ICD-10-CM

## 2019-03-13 NOTE — Unmapped (Signed)
Assessment and Plan:    1. 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 All three sites were biopsied today.    Biopsy by shave technique was recommended which the patient was agreeable to. The risks, benefits, indications, alternatives, and complications were discussed, and informed verbal consent was obtained. Specifically, the expectation of a permanent scar and risk of possible infection were explained and understood. If the procedure were to be declined, the lesion could not be evaluated microscopically for diagnosis or abnormality, including cancer. Pre-procedure time-out was performed to verify the procedure, site and appropriate supplies. We cleaned the site(s) with alcohol and anesthetized with 2% lidocaine with epinephrine. Biopsy via horizontal shave technique was performed. Hemostasis was obtained with aluminum chloride. Petrolatum and bandage were applied. The patient tolerated the procedure well without complications and with minimal blood loss. Written wound care instructions were given. Specimen(s) sent to pathology. We will contact patient with biopsy results and arrange appropriate follow-up.    Ok with MyChart for results.     2. Photodamage, actinic keratoses, few hyperkeratotic lesions treated with cryotherapy today. A total of 3 AKs were treated. The risks, benefits, indications, alternatives, and complications were discussed, and informed consent was obtained. Specifically, the risks of permanent scar, loss or darkening of skin color, blister and recurrence of lesion were discussed. The patient tolerated the procedure well without complications. Wound care instructions were given.    Extensive actinic damage across her upper chest (which I suspect is causing her symptoms), forearms, and legs. Reviewed that she may need to consider PDT for her legs and PDT vs Efudex for her chest in the fall.     Asked that she please try nicotinamide 500 mg PO BID. Briefly discussed topic of starting low dose acitretin but will wait for now.       Plan for RV in 3 months.     Chief Complaint: spot on lower legs, pain right lower leg    HPI: Shelly Cooper is a pleasant 78 y.o. female who was last seen in our clinic on 10/11/2018 by Dr. Odis Luster. At that time we discussed a diagnosis of numerous actinic keratoses and treated 15 with cryotherapy and recommended Efudex to chest and lower legs. She notes that she completed three weeks of Efudex to her lower legs in May but does not feel like this made much difference in many of the raised sites. She notes that last Thursday she developed redness and warmth of her right lower leg. She tried treating this with hydrocortisone, topical Benadryl, and zinc oxide. The pain and redness has significantly improved. She notes that she never used Efudex on her chest. She will intermittently develop redness and swelling on her chest which is quite itchy.       No other new, changing, or symptomatic lesions of concern are reported today.    Pertinent Past Medical History:    Numerous NMSC    Medications:    Current Outpatient Medications on File Prior to Visit   Medication Sig Dispense Refill   ??? abatacept (ORENCIA CLICKJECT) 125 mg/mL AtIn Inject the contents of 1 pen (125 mg) under the skin once a week. 12 mL 3   ??? clopidogrel (PLAVIX) 75 mg tablet Take 75 mg by mouth daily.     ??? fluorouracil (EFUDEX) 5 % cream Apply 2x daily to sun damaged areas for 3 weeks. Stop early if excessive inflammation occurs 40 g 0   ???  hydroxychloroquine (PLAQUENIL) 200 mg tablet Take 1 tablet (200 mg total) by mouth daily. 90 tablet 3   ??? lansoprazole (PREVACID) 30 MG capsule Take 1 capsule by mouth once daily.     ??? losartan (COZAAR) 100 MG tablet Take 100 mg by mouth.     ??? pantoprazole (PROTONIX) 40 MG tablet Take by mouth Two (2) times a day.     ??? torsemide (DEMADEX) 20 MG tablet Take 20 mg by mouth daily as needed.        No current facility-administered medications on file prior to visit.        Allergies: Aspirin and Lyrica  [pregabalin]    Review of Systems: Denies fevers, chills. No nausea, vomiting, diarrhea. No other skin complaints.     Physical Examination:  General: Well-developed, well-nourished, no acute distress.   Neuro : Alert and oriented, answers questions appropriately.  Ext: no inflammatory or dystrophic changes of nails  Skin: Exam was performed today with inspection and palpation of the head/face, neck, chest,arms, hands, legs, and feet.  All areas of skin were normal except for those noted below, and findings include:  There is a 1.2 cm pink plaque with central scale on her right lower leg proximally. There is a 1 cm pink scaly plaque on her right lower leg distally. There is a 1 cm pink scaly plaque on her left lower leg. There are scattered pink macules and thin papules on her lower legs, forearms, and extensively across her chest. There is a scaly patch on her right cheek.

## 2019-03-13 NOTE — Unmapped (Signed)
lvm

## 2019-03-13 NOTE — Unmapped (Addendum)
Shave biopsy   A shave biopsy involves numbing a small area of your skin and then obtaining a sample to help [place] with proper diagnosis or skin condition. Biopsy results typically return in 7 to 14 days.    To care for the area: Leave the bandage in place until the morning after your procedure is performed. On a daily basis, carefully remove the bandage, then shower or wash as usual. Allow water to run over the site. Please do not scrub. Carefully dry the area, then apply ointment (some people develop an allergy to Neosporin, so we recommend Vaseline orAquaphor). Cover the site with a fresh bandage. Should any bleeding occur, apply firm pressure for 15 minutes. The treated site will heal best if  a scab never forms (the wound heals by new skin cells traveling from the outside toward the middle-their journey is easier if no scab stands in their way).    Long-term care: the site will be more sensitive than your surrounding skin. Keep it covered, and remember to apply sunscreen every day to all your exposed skin. A scar may remain which is lighter or pinker than your normal skin. Your body will continue to improve your scar for up to one year.    Infection following this procedure is rare. However, if you are worried about the appearance of your site, contact your doctor. Complete healing may take up to one month. We have a physician on call at all times. If you have any concerns about the site, please call our clinic at [phone]    Cryosurgery  Cryosurgery (???freezing???) uses liquid nitrogen to destroy certain types of skin lesions. Lowering the temperature of the lesion in a small area surrounding skin destroys the lesion. Immediately following cryosurgery, you will notice redness and swelling of the treatment area. Blistering or weeping may occur, lasting approximately one week which will then be followed by crusting. Most areas will heal completely in 10 to 14 days.    Wash the treated areas daily. Allow soap and water to run over the areas, but do not scrub. Should a scab or crust form, allow it to fall off on its own. Do not remove or pick at it. Application of an ointment  and a bandage may make you feel more comfortable, but it is not necessary. Some people develop an allergy to Neosporin, so we recommend that Vaseline or  Aquaphor be used.    The cryotherapy site will be more sensitive than your surrounding skin. Keep it covered, and remember to apply sunscreen every day to all your sun exposed skin. A scar may remain which is lighter or pinker than your normal skin. Your body will continue to improve your scar for up to one year; however a light-colored scar may remain.    Infection following cryotherapy is rare. However if you are worried about the appearance of the treated area, contact your doctor. We have a physician on call at all times. If you have any concerns about the site, please call our clinic at [phone]    Please try over the counter nicotinamide 500 mg twice daily. This has been shown to be helpful for those people who have multiple squamous cell carcinomas.    We could consider a medication called acitretin (Soriatane) in the future. I believe this would also be helpful for you.    For you itching on your chest, you can use pramoxine cream (Cerave Itch Relief) as needed. This area should probably be treated  with Efudex in the fall.

## 2019-03-20 NOTE — Unmapped (Signed)
Results relayed through MyChart as discussed at her visit. Hypertrophic actinic keratoses on right lower leg x 2. For HAKs, will plan on discussion regarding Efudex vs PDT for legs in the fall. No treatment needed for now. No treatment required for eczema on left lower leg.    Final Diagnosis  A:  Right lower proximal leg, shave  - Hypertrophic actinic keratosis, present in the edges of biopsy  ??  B:  Right lower distal leg, shave  - Hypertrophic actinic keratosis, present in the edges of biopsy  ??  C:  Left lower leg, shave  - Subacute eczematous dermatitis     No tracking required.

## 2019-03-22 NOTE — Unmapped (Signed)
LEFT PATIENT MESSAGE

## 2019-03-22 NOTE — Unmapped (Signed)
-----   Message from Newt Lukes, MD sent at 03/22/2019 12:10 PM EDT -----  Regarding: biopsy results  I sent Shelly Cooper a MyChart message several days ago but it does not appear she has read it yet. Please call her to let her know the two sites on her right lower leg were both thicker pre-cancers and the area on he left leg was just an eczema spot. No treatment needed now but we'll talk about treatment of pre-cancers for her legs and chest at appointment in the fall.    Call center folks, will you please call her to arrange follow-up in 3-4 months for Aks? Looks we called her on 7/13 but she has not responded.    Raynelle Fanning    Final Diagnosis  A:  Right lower proximal leg, shave  - Hypertrophic actinic keratosis, present in the edges of biopsy  ??  B:  Right lower distal leg, shave  - Hypertrophic actinic keratosis, present in the edges of biopsy  ??  C:  Left lower leg, shave  - Subacute eczematous dermatitis

## 2019-04-05 IMAGING — CT CT ANGIO CHEST
2 of 6 series · 19 of 46 positions shown · IV contrast (APPLIED)
Comparison: Chest radiographs dated 01/19/2017

CLINICAL DATA: Mid chest pain

EXAM:
CT ANGIOGRAPHY CHEST WITH CONTRAST
TECHNIQUE: Multidetector CT imaging of the chest was performed using the
standard protocol during bolus administration of intravenous
contrast. Multiplanar CT image reconstructions and MIPs were
obtained to evaluate the vascular anatomy.
CONTRAST:  75 mL Isovue 370 IV

[Series 5: thins · axial · 0.58mm/px · z∈[+250,+501]mm · 17 of 275 slices shown]
[im 12/275  lung]
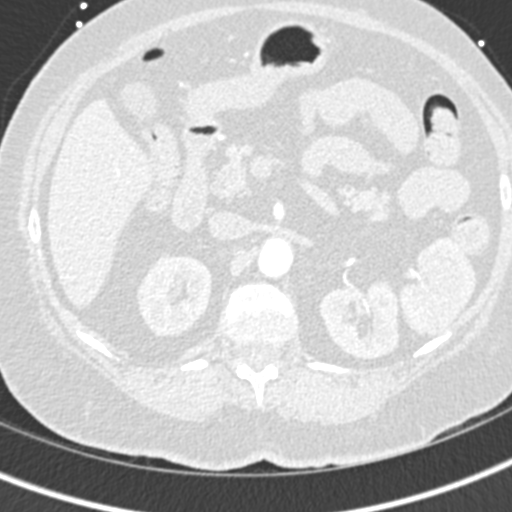
[im 24/275  soft-tissue]
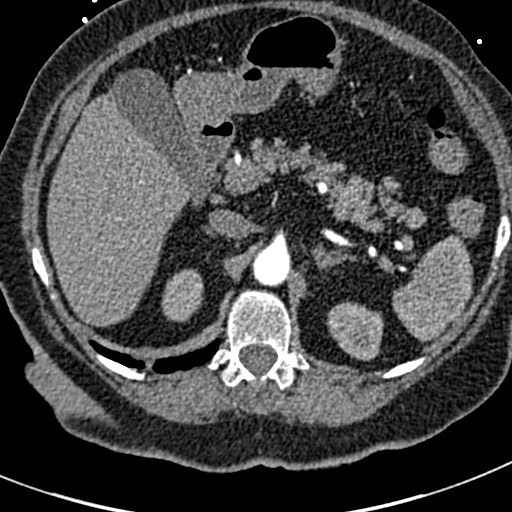
[im 48/275  lung]
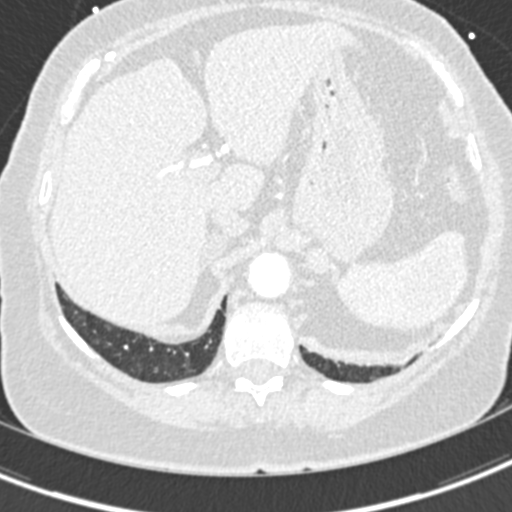
[im 60/275  soft-tissue]
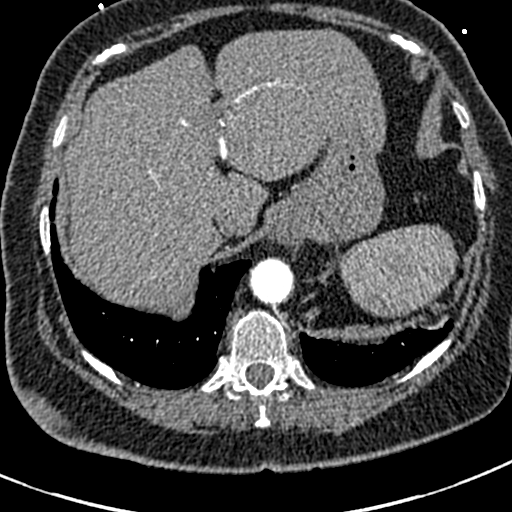
[im 72/275  lung]
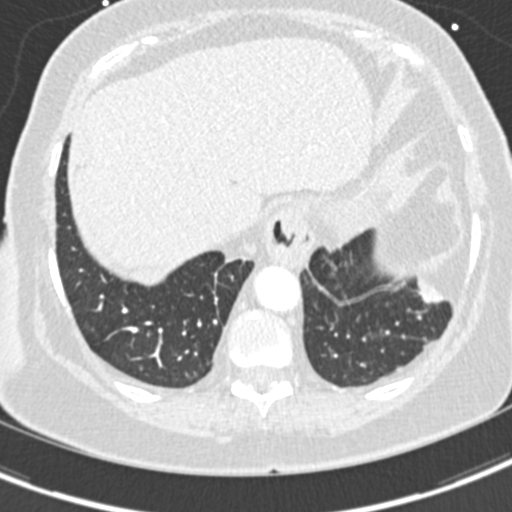
[im 96/275  soft-tissue]
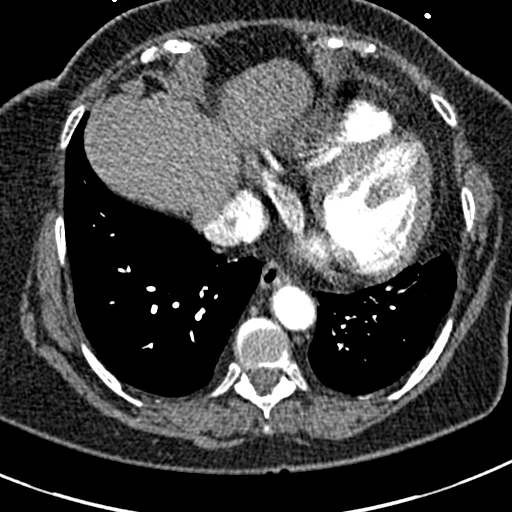
[im 108/275  lung]
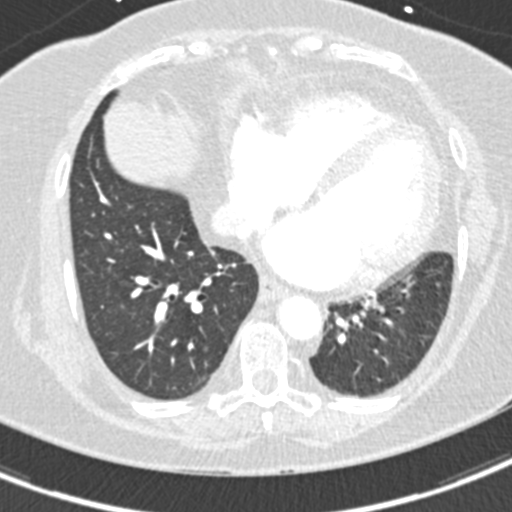
[im 120/275  soft-tissue]
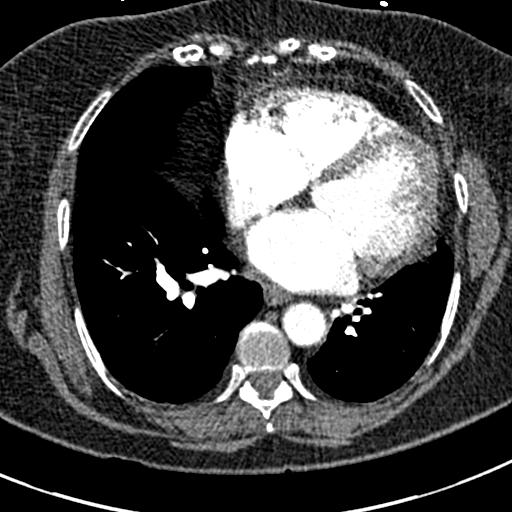
[im 143/275  lung]
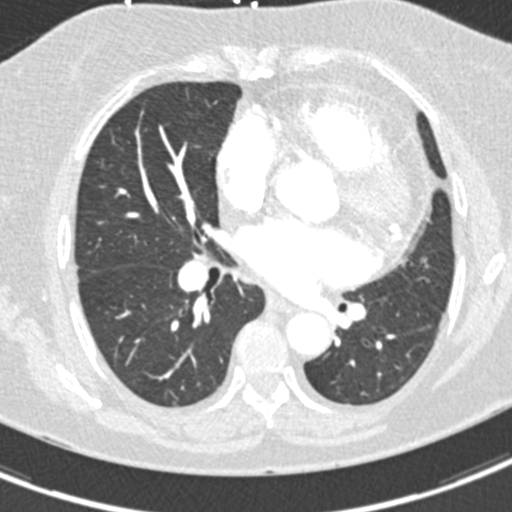
[im 155/275  soft-tissue]
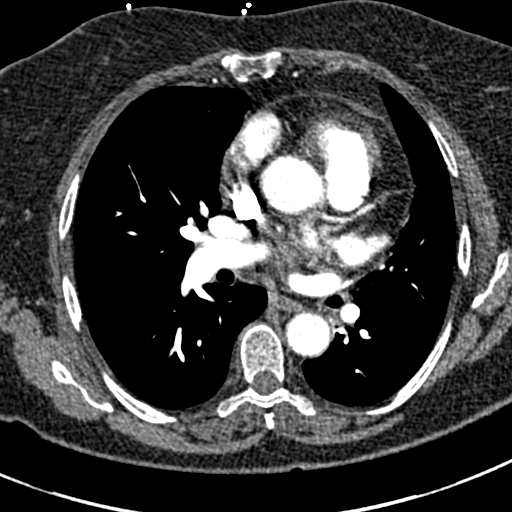
[im 167/275  lung]
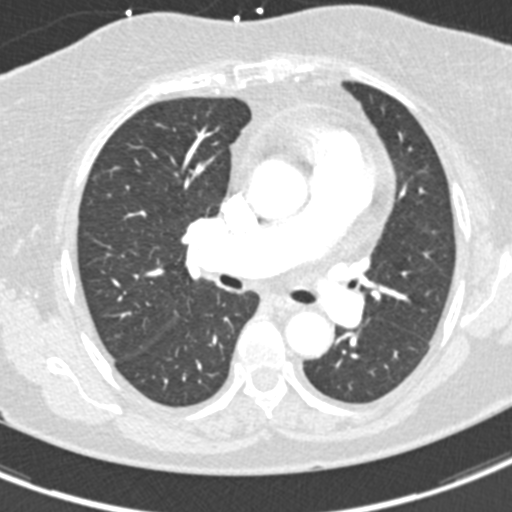
[im 179/275  soft-tissue]
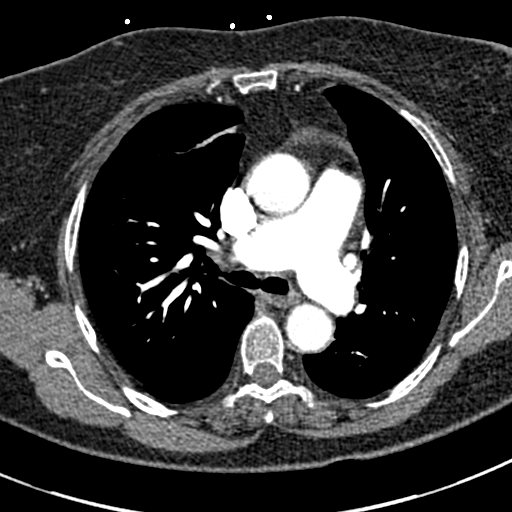
[im 203/275  lung]
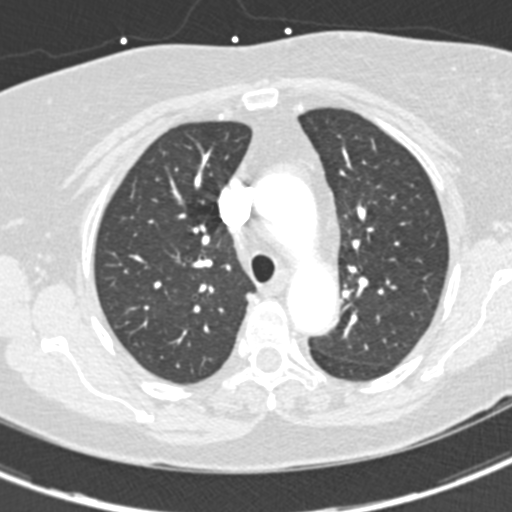
[im 215/275  soft-tissue]
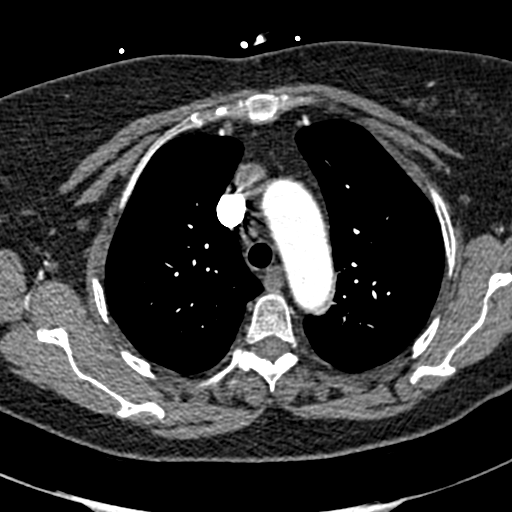
[im 227/275  lung]
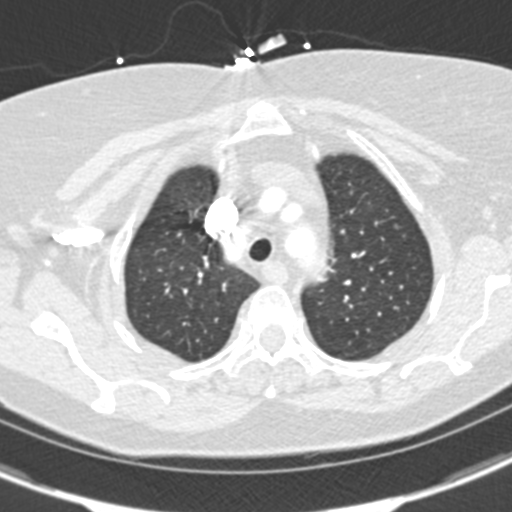
[im 251/275  soft-tissue]
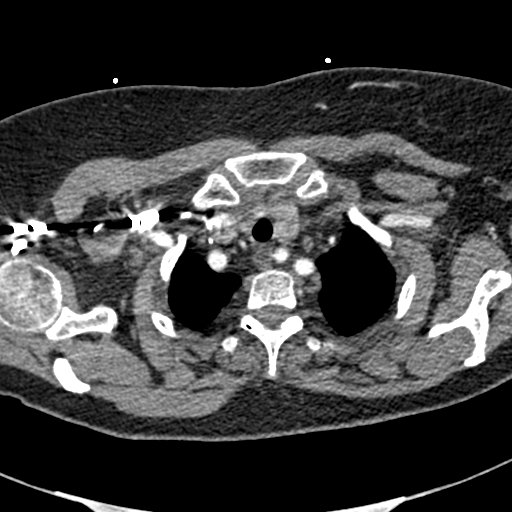
[im 263/275  lung]
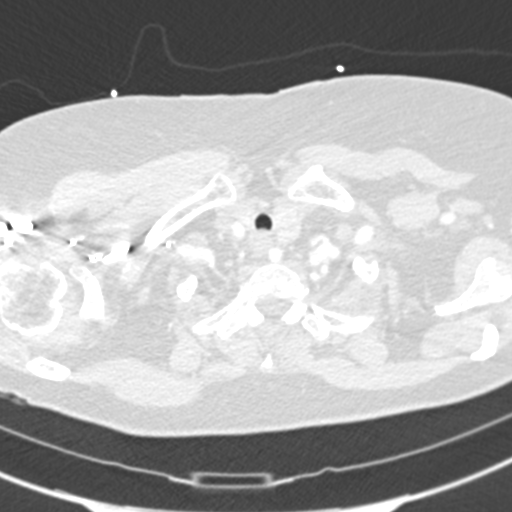

[Series 7: coronal mpr · coronal · 0.54mm/px · 2 of 92 slices shown]
[im 31/92  soft-tissue]
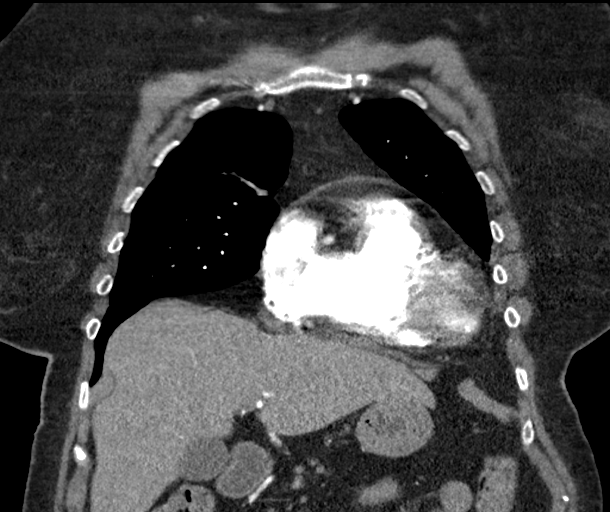
[im 61/92  soft-tissue]
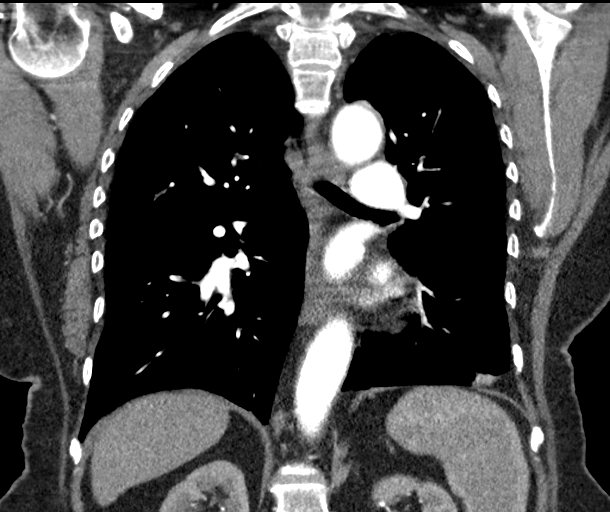

[19 of 46 positions shown; findings below may reference images not displayed]

FINDINGS: Cardiovascular: Satisfactory opacification of the bilateral
pulmonary arteries to the segmental level. No evidence pulmonary
embolism.

No evidence of thoracic aortic aneurysm or dissection.

Heart is top-normal in size.  No pericardial effusion.

Coronary atherosclerosis in the LAD.

Mediastinum/Nodes: No suspicious mediastinal lymphadenopathy.

Visualized thyroid is mildly heterogeneous/nodular.

Lungs/Pleura: Linear scarring/ atelectasis in the anterior right
upper lobe.

No focal consolidation.

No suspicious pulmonary nodules.

No pleural effusion or pneumothorax.

Upper Abdomen: Visualized upper abdomen is notable for a tiny hiatal
hernia and small bilateral adrenal adenomas measuring up to 1.9 cm
on the right.

Musculoskeletal: Visualized osseous structures are within normal
limits.

Review of the MIP images confirms the above findings.
IMPRESSION: No evidence pulmonary embolism.

No evidence of acute cardiopulmonary disease.

Small bilateral adrenal adenomas measuring up to 1.9 cm on the
right.

## 2019-04-06 MED ORDER — DOXYCYCLINE MONOHYDRATE 100 MG CAPSULE
ORAL_CAPSULE | Freq: Two times a day (BID) | ORAL | 0 refills | 10.00000 days | Status: CP
Start: 2019-04-06 — End: 2019-04-16

## 2019-04-06 NOTE — Unmapped (Signed)
Called [name] to discuss the doxycycline and to let us know if it is not improving by Monday or Tuesday. She was very Adult nurse.

## 2019-04-06 NOTE — Unmapped (Signed)
Shelly Cooper left a VM on the nurse line saying she had 3 biopsies on July 13th. The two on her right leg hurt, and she thinks they are infected. She would like someone to take a look at them. Photo in MyChart.

## 2019-05-03 ENCOUNTER — Other Ambulatory Visit: Payer: Self-pay | Admitting: Gerontology

## 2019-05-03 DIAGNOSIS — R05 Cough: Secondary | ICD-10-CM

## 2019-05-03 DIAGNOSIS — R0601 Orthopnea: Secondary | ICD-10-CM

## 2019-05-03 DIAGNOSIS — I890 Lymphedema, not elsewhere classified: Secondary | ICD-10-CM

## 2019-05-03 DIAGNOSIS — R053 Chronic cough: Secondary | ICD-10-CM

## 2019-05-09 ENCOUNTER — Encounter (INDEPENDENT_AMBULATORY_CARE_PROVIDER_SITE_OTHER): Payer: Medicare Other | Admitting: Ophthalmology

## 2019-05-09 ENCOUNTER — Other Ambulatory Visit: Payer: Self-pay

## 2019-05-09 DIAGNOSIS — H43813 Vitreous degeneration, bilateral: Secondary | ICD-10-CM

## 2019-05-09 DIAGNOSIS — H26492 Other secondary cataract, left eye: Secondary | ICD-10-CM | POA: Diagnosis not present

## 2019-05-09 DIAGNOSIS — I10 Essential (primary) hypertension: Secondary | ICD-10-CM | POA: Diagnosis not present

## 2019-05-09 DIAGNOSIS — H35033 Hypertensive retinopathy, bilateral: Secondary | ICD-10-CM | POA: Diagnosis not present

## 2019-05-09 DIAGNOSIS — H353132 Nonexudative age-related macular degeneration, bilateral, intermediate dry stage: Secondary | ICD-10-CM | POA: Diagnosis not present

## 2019-05-09 DIAGNOSIS — M069 Rheumatoid arthritis, unspecified: Secondary | ICD-10-CM

## 2019-05-10 ENCOUNTER — Ambulatory Visit
Admission: RE | Admit: 2019-05-10 | Discharge: 2019-05-10 | Disposition: A | Discharge status: Home / Self Care | Payer: Medicare Other | Source: Ambulatory Visit | Attending: Gerontology | Admitting: Gerontology

## 2019-05-10 ENCOUNTER — Ambulatory Visit: Admission: RE | Admit: 2019-05-10 | Payer: Medicare Other | Source: Ambulatory Visit

## 2019-05-10 DIAGNOSIS — R053 Chronic cough: Secondary | ICD-10-CM

## 2019-05-10 DIAGNOSIS — R05 Cough: Secondary | ICD-10-CM | POA: Diagnosis not present

## 2019-05-10 DIAGNOSIS — R0601 Orthopnea: Secondary | ICD-10-CM | POA: Insufficient documentation

## 2019-05-10 DIAGNOSIS — I890 Lymphedema, not elsewhere classified: Secondary | ICD-10-CM | POA: Insufficient documentation

## 2019-05-10 HISTORY — DX: Malignant (primary) neoplasm, unspecified: C80.1

## 2019-05-10 MED ORDER — IOHEXOL 300 MG/ML  SOLN
60.0000 mL | Freq: Once | INTRAMUSCULAR | Status: AC | PRN
  Administered 2019-05-10: 12:00:00 60 mL via INTRAVENOUS

## 2019-05-17 ENCOUNTER — Encounter: Admit: 2019-05-17 | Discharge: 2019-05-18 | Payer: MEDICARE

## 2019-05-17 DIAGNOSIS — Z87891 Personal history of nicotine dependence: Secondary | ICD-10-CM

## 2019-05-17 DIAGNOSIS — Z23 Encounter for immunization: Secondary | ICD-10-CM

## 2019-05-17 DIAGNOSIS — I252 Old myocardial infarction: Secondary | ICD-10-CM

## 2019-05-17 DIAGNOSIS — Z9189 Other specified personal risk factors, not elsewhere classified: Secondary | ICD-10-CM

## 2019-05-17 DIAGNOSIS — Z780 Asymptomatic menopausal state: Secondary | ICD-10-CM

## 2019-05-17 DIAGNOSIS — R944 Abnormal results of kidney function studies: Secondary | ICD-10-CM

## 2019-05-17 DIAGNOSIS — M0600 Rheumatoid arthritis without rheumatoid factor, unspecified site: Secondary | ICD-10-CM

## 2019-05-17 DIAGNOSIS — Z7902 Long term (current) use of antithrombotics/antiplatelets: Secondary | ICD-10-CM

## 2019-05-17 MED ORDER — ORENCIA CLICKJECT 125 MG/ML SUBCUTANEOUS AUTO-INJECTOR
SUBCUTANEOUS | 3 refills | 0.00000 days | Status: CP
Start: 2019-05-17 — End: ?

## 2019-05-17 NOTE — Unmapped (Signed)
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  Pt also has a history of non-melanoma skin cancer but was cleared by her dermatologist to start Enbrel, which was started in November 2016 but discontinued in May 2017 due to worsening squamous cell skin lesions. She was continued hydroxychloroquine and started on Sulfasalazine in May 2017 but we have not been able to titrate dose beyond 500 mg po bid due to cough and concerns of possible side effects. She called in December 2017 stating that the sulfasalazine was causing swelling in the face and so advised her to stop. She reported 22lb weight loss after stopping the sulfasalazine. She was then seen by Carlus Pavlov PA-C in February 2018 and had recently been diagnosed with pericarditis and started on colchicine. 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 At follow-up in June 2018, reported tapering off colchicine and medrol dose pack for pleuritis and pericarditis. She reported compliance with hydroxychloroquine 200 mg qd. We had discussed starting Actemra/Tocilizumab for RA with pericarditis and pleuritis and patient finally agreed to start following cardiology approval in October 2018. Due to $500 copay cost following first infusion, patient declined continuing with tocilizumab infusions. She was not interested in pursuing [place] injections with tocilizumab or other therapy. She then was started on tofacitinib, but one week after starting she reported abdominal pain and h/o diverticulitis so this was discontinued.  Started Orencia in October 2019 with excellent symptomatic control.     HPI: Shelly Cooper is a 78 y.o. 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 She agreed to Orencia at follow-up in September 2019 but had delay in starting till late October 2019. At follow-up in December 2019 reported ongoing symptoms but advised her to take more time with medication. She is also taking hydroxychloroquine 200 mg po qd. She returns today for follow-up.    Regarding RA, her joint pain in well controlled. She reports minimal to no joint pain in her hands, wrist, shoulders, knees, and ankles. Her swelling has markedly decreased. She has no limitations in her daily life from her joint pain. She currently takes HCQ once a day and administers Orencia injection once a week. Skin changes, rash from Orencia have not returned.     From an RA standpoint, she believes the [name] is working extremely well, however she expresses concern that the [name] is causing her SOB. This SOB and breathlessness started around the same time she started Orencia. SOB feels like shes being smothered. The SOB/breathlessness has no predictive pattern. It occurs at rest and on exertion, it occurs while standing, sitting, or laying down. She says that it comes on abruptly and then she becomes very anxious. It will last for a few minutes at a time. She reports that she worries about the SOB frequently, and she knows her anxiety make the SOB worse when it comes on. She denies palpitations, dizziness, and CP with the SOB.    PFTs in 2015 were normal. Remote 3 year smoking hx, quit 1973. Most recent Echo 05/2018 EF 50%. CT chest 05/2019 showed normal lung, borderline cardiomegaly.    In Aug, she presented to urgent  care with cough, fevers. COVID negative. Dx with PNA.. Prescribed Cefdinir. Symptoms improved, however she has a slight elevation in Cr following infection and antibiotics. She was told to hold her torsemide given elevated Cr. WBC and Cr have since normalized. She will discuss with her Cardiologist next week if she should resume diuresis.      ROS??:Attests to the above otherwise review of all other system is negative.??  ??  Past Medical and Surgical History:  ??  Patient Active Problem List    Diagnosis Date Noted   ??? Chest pain 09/15/2016   ??? Seronegative rheumatoid arthritis (CMS-HCC) 03/14/2015   ??? De Quervain's tenosynovitis, bilateral 07/17/2014   ??? Nonspecific immunological findings 03/14/2014   ??? Edema 01/03/2014   ??? Primary localized osteoarthrosis, lower leg 07/01/2012   ??? Derangement of medial meniscus 06/23/2012   ??? Other specified cardiac dysrhythmias 03/17/2012   ??? Coronary atherosclerosis (RAF-HCC) 01/29/2012   ??? Essential hypertension 01/29/2012   ??? 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 HYSTERECTOMY  1978    fibroids   ??? NASAL POLYP EXCISION     ??? SKIN BIOPSY     ??? [facility] HISTORICAL RESULT)  2001    post-MI   ??? TRIGGER FINGER RELEASE       Allergies:   ??  Allergies   Allergen Reactions   ??? Aspirin Swelling     Other reaction(s): Swelling  Facial swelling  Other reaction(s): UNKNOWN   ??? Lyrica  [Pregabalin] Swelling     Other reaction(s): Swelling  Other reaction(s): SWELLING     Current Outpatient Medications:  ??  Current Outpatient Medications:   ???  abatacept (ORENCIA CLICKJECT) 125 mg/mL AtIn, Inject the contents of 1 pen (125 mg) under the skin once a week., Disp: 12 mL, Rfl: 3  ???  clopidogrel (PLAVIX) 75 mg tablet, Take 75 mg by mouth daily., Disp: , Rfl:   ???  fluorouracil (EFUDEX) 5 % cream, Apply 2x daily to sun damaged areas for 3 weeks. Stop early if excessive inflammation occurs, Disp: 40 g, Rfl: 0  ???  lansoprazole (PREVACID) 30 MG capsule, Take 1 capsule by mouth once daily., Disp: , Rfl:   ???  losartan (COZAAR) 100 MG tablet, Take 100 mg by mouth., Disp: , Rfl:   ???  pantoprazole (PROTONIX) 40 MG tablet, Take by mouth Two (2) times a day., Disp: , Rfl:   ??? torsemide (DEMADEX) 20 MG tablet, Take 20 mg by mouth daily as needed. , Disp: , Rfl:       Immunization History   Administered Date(s) Administered   ??? INFLUENZA TIV (TRI) 31MO+ W/ PRESERV (IM) 06/14/2014   ??? INFLUENZA TIV (TRI) PF (IM) 06/15/2007, 06/05/2008, 06/10/2010, 06/30/2011   ??? Influenza Vaccine Quad (IIV4 PF) 90mo+ injectable 05/17/2015, 06/10/2017, 05/19/2018, 05/17/2019   ??? Influenza Virus Vaccine, unspecified formulation 06/24/2014, 06/02/2016   ??? PNEUMOCOCCAL POLYSACCHARIDE 23 10/06/2016   ??? Pneumococcal Conjugate 13-Valent 04/03/2014     ????  PHYSICAL EXAM??  Vital signs: BP 150/68 (BP Site: R Arm, BP Position: Sitting, BP Cuff Size: Large)  - Pulse 76  - Temp 36.8 ??C  - Ht 162.6 cm (5' 4.02)  - Wt 84.6 kg (186 lb 9.6 oz)  - BMI 32.01 kg/m?? Body mass index is 32.01 kg/m??.  Gen: Well-developed, well-nourished adult in no apparent distress.  Pleasant.  HEENT: PERRLA, EOMI, MMM, oropharynx in pink  without ulcerations or exudates visualized.??  No cervical lymphadenopathy.   Lungs: normal WOB, no use of accessory muscles, no cough, CTAB without wheezing and crackles, good air movement throughout  CV: RRR, Normal S1/S2, No murmurs/rubs/gallops heard, trace LE edema to ankles bilaterally, 2+ radial pulses   Neuro: Good comprehension/cognition.  Gait intact. Moves all extremities equally.  Comprehensive Musculoskeletal Examination:??  UE:  Full ROM in shoulders, elbows, wrists, hands, and fingers. Mild enlargement of 2nd R MCP and both thumb MCPs bilaterally. No erythema, warmth, or tenderness to palpation in hands and fingers.  LE: Feet/toes wnl. Ankles and knees wnl. No erythema, warmth, or tenderness to palpation in feet, toes, ankles, knees.  Skin: Scattered and extensive erythematous scaly lesions on bilateral lower extremities and on dorsal aspect of hands, likely related to the squamous cell.     Results: recently completed labs at [facility](05/17/2019 reviewed in Care Everywhere.    ??GENERAL SUMMARY/IMPRESSION AND RECOMMENDATIONS: ??  ???In summary, the patient is a 78 y.o. female with seronegative RA and non-melanoma skin cancer s/p pericarditis and pleuritis in 2018 now on hydroxychloroquine 200 mg qd and abatacept weekly injection.   1. Seronegative rheumatoid arthritis (CMS-HCC)  From an RA perspective, she is doing excellent. This is the best I have seen her joints. Although she expresses concern that [name] may be causing her respiratory symptoms, I encouraged her to continue the Orencia given her clinical improvement. Given her cardiac hx and improvement on Orencia, I will stop hydroxychloroquine. It seems likely that her clinical improvement is related to [facility] and she no longer needs the hydroxychloroquine. She also expresses desire to reduce medication burden. I think her respiratory symptoms may be cardiac in nature or related to anxiety. She is due to follow up with Cardiology next week. She may also benefit from starting anxiolytic medication in the future.  - abatacept (ORENCIA CLICKJECT) 125 mg/mL AtIn; Inject the contents of 1 pen (125 mg) under the skin once a week.  Dispense: 12 mL; Refill: 3  - Dexa Bone Density Skeletal; Future    HCM:  At risk for decreased bone density: She has never had DEXA scan. Recommended this today. Placed referral through [facility] system, however she may be able to get this closer to home through her PCP.  - Dexa Bone Density Skeletal; Future  Influenza vaccine: received this today in clinic as well            Diagnosis ICD-10-CM Associated Orders   1. Seronegative rheumatoid arthritis (CMS-HCC)  M06.00 abatacept (ORENCIA CLICKJECT) 125 mg/mL AtIn     Dexa Bone Density Skeletal   2. At risk for decreased bone density  Z91.89 Dexa Bone Density Skeletal   3. Asymptomatic menopausal state   Z78.0 Dexa Bone Density Skeletal     Patient Instructions   Stop the hydroxychloroquine.    Continue Orencia for now. Make sure you complete assistance program application Patient was staffed with Rumey C. Scarlette Calico, MD, PhD      Charlotta Newton, MD PGY-1   [facility] Internal Medicine

## 2019-05-17 NOTE — Unmapped (Addendum)
Stop the hydroxychloroquine.    Continue Orencia for now. Make sure you complete assistance program application

## 2019-05-18 DIAGNOSIS — M0600 Rheumatoid arthritis without rheumatoid factor, unspecified site: Secondary | ICD-10-CM

## 2019-05-18 NOTE — Unmapped (Signed)
Per test claim for Orencia at the [facility], patient needs Medication Assistance Program for Prior Authorization.

## 2019-05-26 ENCOUNTER — Encounter: Admit: 2019-05-26 | Discharge: 2019-05-27 | Payer: MEDICARE

## 2019-05-26 DIAGNOSIS — D485 Neoplasm of uncertain behavior of skin: Secondary | ICD-10-CM

## 2019-05-26 DIAGNOSIS — C44722 Squamous cell carcinoma of skin of right lower limb, including hip: Secondary | ICD-10-CM

## 2019-05-26 DIAGNOSIS — L570 Actinic keratosis: Secondary | ICD-10-CM

## 2019-05-26 DIAGNOSIS — L309 Dermatitis, unspecified: Secondary | ICD-10-CM

## 2019-05-26 MED ORDER — FLUOCINONIDE 0.05 % TOPICAL OINTMENT
Freq: Two times a day (BID) | TOPICAL | 0 refills | 0.00000 days | Status: CP
Start: 2019-05-26 — End: 2020-05-25

## 2019-05-26 NOTE — Unmapped (Signed)
Shave biopsy   A shave biopsy involves numbing a small area of your skin and then obtaining a sample to help [place] with proper diagnosis or skin condition. Biopsy results typically return in 7 to 14 days.    To care for the area: Leave the bandage in place until the morning after your procedure is performed. On a daily basis, carefully remove the bandage, then shower or wash as usual. Allow water to run over the site. Please do not scrub. Carefully dry the area, then apply ointment (some people develop an allergy to Neosporin, so we recommend Vaseline orAquaphor). Cover the site with a fresh bandage. Should any bleeding occur, apply firm pressure for 15 minutes. The treated site will heal best if  a scab never forms (the wound heals by new skin cells traveling from the outside toward the middle-their journey is easier if no scab stands in their way).    Long-term care: the site will be more sensitive than your surrounding skin. Keep it covered, and remember to apply sunscreen every day to all your exposed skin. A scar may remain which is lighter or pinker than your normal skin. Your body will continue to improve your scar for up to one year.    Infection following this procedure is rare. However, if you are worried about the appearance of your site, contact your doctor. Complete healing may take up to one month. We have a physician on call at all times. If you have any concerns about the site, please call our clinic at [phone]    Cryosurgery  Cryosurgery (???freezing???) uses liquid nitrogen to destroy certain types of skin lesions. Lowering the temperature of the lesion in a small area surrounding skin destroys the lesion. Immediately following cryosurgery, you will notice redness and swelling of the treatment area. Blistering or weeping may occur, lasting approximately one week which will then be followed by crusting. Most areas will heal completely in 10 to 14 days.    Wash the treated areas daily. Allow soap and water to run over the areas, but do not scrub. Should a scab or crust form, allow it to fall off on its own. Do not remove or pick at it. Application of an ointment  and a bandage may make you feel more comfortable, but it is not necessary. Some people develop an allergy to Neosporin, so we recommend that Vaseline or  Aquaphor be used.    The cryotherapy site will be more sensitive than your surrounding skin. Keep it covered, and remember to apply sunscreen every day to all your sun exposed skin. A scar may remain which is lighter or pinker than your normal skin. Your body will continue to improve your scar for up to one year; however a light-colored scar may remain.    Infection following cryotherapy is rare. However if you are worried about the appearance of the treated area, contact your doctor. We have a physician on call at all times. If you have any concerns about the site, please call our clinic at [phone]

## 2019-05-26 NOTE — Unmapped (Signed)
Assessment and Plan:    Neoplasm unspecified, right thigh, concerning for SCC  -Given probability of being a squamous cell carcinoma in situ at least, discussed biopsy followed by treatment with [facility] and patient agreeable  Location: Right thigh  Biopsy of the area in question was performed.  The site was cleansed with alcohol and anesthetized with 2% lidocaine with epinephrine.  Biopsy was performed with a shave tool and hemostasis was obtained in the usual fashion with Aluminum chloride, pressure, and/or electrocautery.  Prior to the procedure, risks and benefits were discussed including but not limited to risk of scarring, recurrence of lesion, potential need for other therapy depending on diagnosis with verbal informed consent being obtained.  Area was dressed with petrolatum and bandage.  Wound care instructions given.  We will contact the patient with results when available.   ED&C Procedure Note:  INDICATION: neoplasm of uncertain behavior  After verbal informed consent was obtained, the lesion was prepped and anesthesized with 1% lidocaine with epinephrine. Curettage was then performed with a #4 currette, followed by electrodessication.  This was repeated for a total of 3 cycles and then dressed with petrolatum and a bandage.  Wounds care instructions were given.  The final lesion size after first pass of the currette was 16 mm.    Actinic keratoses, bilateral upper extremities  After verbal informed consent discussing the risks and benefits of the procedure including scarring, pigment alteration, recurrence, or persistence of the lesion, cryotherapy using liquid nitrogen was performed. 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   Unclear if she has tried the nicotinamide 500 mg p.o. twice daily as instructed last visit.  Will discuss at follow-up visit.    She understands that she is at high risk for new skin cancers and is agreeable to more frequent visits.    Follow-up 5 weeks     Chief Complaint: spot on lower legs, pain right lower leg    HPI: Shelly Cooper is a pleasant 78 y.o. female who was last seen in our clinic on 03/13/19 by Dr. Domenic Schwab. At that time she had 3 biopsies performed on her right lower leg proximal, right lower leg distal, and left lower leg.  The lesion on the right lower proximal leg and right lower distal leg below showed hypertrophic actinic keratoses.  The lesion on the left lower leg showed subacute eczematous dermatitis.  Dr. Domenic Schwab had planned for Efudex or bluelight therapy in the fall.    Since last visit, she did have very slow to heal and potentially infected wounds following the biopsy.  She completed a course of doxycycline with improvement.  However the wounds are still red.    Today she would like a recheck of the spots.  In addition she notices the following:  -Right thigh, present for several months, tried zinc oxide and cortisone without improvement.  Very painful with stabbing pains or pain with rubbing over it.  -Scaly spots on hands and arms, present for months.  No treatments tried.  -Scaly areas on legs, red, particularly left leg.  No treatments tried aside from OTC cerave itch lotion, which helps a little.    No other new, changing, or symptomatic lesions of concern  are reported today.    Pertinent Past Medical History:    Numerous NMSC  GERD  HTN  RA    Medications:    Current Outpatient Medications on File Prior to Visit   Medication Sig Dispense Refill   ??? abatacept (ORENCIA CLICKJECT) 125 mg/mL AtIn Inject the contents of 1 pen (125 mg) under the skin once a week. 12 mL 3   ??? clopidogrel (PLAVIX) 75 mg tablet Take 75 mg by mouth daily.     ??? fluorouracil (EFUDEX) 5 % cream Apply 2x daily to sun damaged areas for 3 weeks. Stop early if excessive inflammation occurs 40 g 0   ??? lansoprazole (PREVACID) 30 MG capsule Take 1 capsule by mouth once daily.     ??? losartan (COZAAR) 100 MG tablet Take 100 mg by mouth.     ??? pantoprazole (PROTONIX) 40 MG tablet Take by mouth Two (2) times a day.     ??? torsemide (DEMADEX) 20 MG tablet Take 20 mg by mouth daily as needed.        No current facility-administered medications on file prior to visit.        Allergies: Aspirin and Lyrica  [pregabalin]     Social History:  -Has a daughter, and grandchild; planning on vacation towards the end of October    Review of Systems: Denies fevers, chills. No nausea, vomiting, diarrhea. No other skin complaints.     Physical Examination:  General: Well-developed, well-nourished, no acute distress.   Neuro : Alert and oriented, answers questions appropriately.  Ext: no inflammatory or dystrophic changes of nails  Skin: Exam was performed today with inspection and palpation of the bilateral upper extremities and bilateral lower extremities.  All areas of skin were normal except for those noted below, and findings include:  -Right thigh with a 1.5 cm hyperkeratotic papule, tender to palpation   -Bilateral upper extremities including dorsal hands with erythematous scaly macules x4  -Left leg with scattered erythematous scaly plaques   -Right leg with new violaceous scars, the most proximal with slight hyperkeratosis on the distal edge

## 2019-06-06 ENCOUNTER — Other Ambulatory Visit: Payer: Self-pay

## 2019-06-06 ENCOUNTER — Encounter (INDEPENDENT_AMBULATORY_CARE_PROVIDER_SITE_OTHER): Payer: Medicare Other | Admitting: Ophthalmology

## 2019-06-06 DIAGNOSIS — H2702 Aphakia, left eye: Secondary | ICD-10-CM

## 2019-06-06 NOTE — Unmapped (Signed)
Patient notified of results.  Discussed that ED&C would not be considered adequate treatment.  Recommend Mohs given size and she is agreeable.  Mohs referral placed.    Right thigh, shave  -Squamous cell carcinoma, moderately-poorly differentiated, keratinizing  Size: 15 mm  Histologic grade: 3/4  Anatomic level: IV  Thickness: At least 3 mm  Perineural invasion: Absent  Tumor budding: Absent  Margins: present at specimen base

## 2019-06-06 NOTE — Unmapped (Signed)
Addended by: Inis Sizer A on: 06/05/2019 05:11 PM     Modules accepted: Orders

## 2019-06-16 ENCOUNTER — Ambulatory Visit: Admit: 2019-06-16 | Discharge: 2019-06-17 | Payer: MEDICARE

## 2019-06-20 NOTE — Unmapped (Signed)
Called patient to discuss bone density showing decline in density. 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 FRAX score based on hip was 16% for major osteoporotic fracture and 3.7% for hip. Has osteoporosis in lumbar spine anyways and density declining. Called patient to discuss metabolic bone therapy. No answer. Left message of my attempt. Will send result letter and mychart message as well.

## 2019-06-26 DIAGNOSIS — Z79899 Other long term (current) drug therapy: Principal | ICD-10-CM

## 2019-06-26 DIAGNOSIS — M0600 Rheumatoid arthritis without rheumatoid factor, unspecified site: Principal | ICD-10-CM

## 2019-06-26 DIAGNOSIS — M810 Age-related osteoporosis without current pathological fracture: Principal | ICD-10-CM

## 2019-06-26 NOTE — Unmapped (Signed)
Called patient. Returned her phone call. She was away last week and just returned. She got message about her bone density.  She does have GERD/reflux. She needs to take Protonix to control symptoms. If she misses dose, has symptoms.     We discussed Fosamax, Reclast and Prolia.     Creatinine looks okay.   She is leaning towards Reclast.   Needs labwork and plans to complete at [facility].   She will discuss with PCP and Carlus Pavlov [facility] at appointment in December.    She appreciated phone call.    Tyjah Hai C. Scarlette Calico, MD, PhD  Assistant Professor of Medicine  Department of Medicine/Division of Rheumatology  [facility]

## 2019-06-26 NOTE — Unmapped (Signed)
Reason for call:     Patient is returning call. She said that she received message from you on Friday.     Last ov: 05/17/2019  Next ov: 08/17/2019

## 2019-06-27 ENCOUNTER — Encounter: Admit: 2019-06-27 | Discharge: 2019-06-28 | Payer: MEDICARE

## 2019-06-27 LAB — CBC W/ AUTO DIFF
BASOPHILS ABSOLUTE COUNT: 0 10*9/L (ref 0.0–0.1)
BASOPHILS RELATIVE PERCENT: 0.6 %
EOSINOPHILS ABSOLUTE COUNT: 0.3 10*9/L (ref 0.0–0.4)
EOSINOPHILS RELATIVE PERCENT: 5.2 %
HEMATOCRIT: 38.8 % (ref 36.0–46.0)
HEMOGLOBIN: 12.4 g/dL — ABNORMAL LOW (ref 13.5–16.0)
LARGE UNSTAINED CELLS: 2 % (ref 0–4)
LYMPHOCYTES ABSOLUTE COUNT: 1.7 10*9/L (ref 1.5–5.0)
LYMPHOCYTES RELATIVE PERCENT: 28.2 %
MEAN CORPUSCULAR HEMOGLOBIN CONC: 32 g/dL (ref 31.0–37.0)
MEAN CORPUSCULAR HEMOGLOBIN: 29.4 pg (ref 26.0–34.0)
MEAN PLATELET VOLUME: 6.9 fL — ABNORMAL LOW (ref 7.0–10.0)
MONOCYTES ABSOLUTE COUNT: 0.4 10*9/L (ref 0.2–0.8)
MONOCYTES RELATIVE PERCENT: 6.1 %
NEUTROPHILS ABSOLUTE COUNT: 3.5 10*9/L (ref 2.0–7.5)
NEUTROPHILS RELATIVE PERCENT: 57.9 %
PLATELET COUNT: 258 10*9/L (ref 150–440)
RED CELL DISTRIBUTION WIDTH: 13.7 % (ref 12.0–15.0)
WBC ADJUSTED: 6 10*9/L (ref 4.5–11.0)

## 2019-06-27 LAB — PARATHYROID HOMONE (PTH): CALCIUM: 8.8 mg/dL (ref 8.5–10.2)

## 2019-06-27 LAB — BASOPHILS ABSOLUTE COUNT: Basophils:NCnc:Pt:Bld:Qn:Automated count: 0

## 2019-06-27 LAB — COMPREHENSIVE METABOLIC PANEL
ALBUMIN: 3.8 g/dL (ref 3.5–5.0)
ALKALINE PHOSPHATASE: 94 U/L (ref 38–126)
ALT (SGPT): 17 U/L (ref <35)
ANION GAP: 6 mmol/L — ABNORMAL LOW (ref 7–15)
AST (SGOT): 30 U/L (ref 14–38)
BILIRUBIN TOTAL: 0.3 mg/dL (ref 0.0–1.2)
BLOOD UREA NITROGEN: 17 mg/dL (ref 7–21)
BUN / CREAT RATIO: 19
CALCIUM: 9 mg/dL (ref 8.5–10.2)
CO2: 27 mmol/L (ref 22.0–30.0)
CREATININE: 0.91 mg/dL (ref 0.60–1.00)
EGFR CKD-EPI AA FEMALE: 70 mL/min/{1.73_m2} (ref >=60)
EGFR CKD-EPI NON-AA FEMALE: 61 mL/min/{1.73_m2} (ref >=60)
GLUCOSE RANDOM: 115 mg/dL (ref 70–179)
POTASSIUM: 4.1 mmol/L (ref 3.5–5.0)
SODIUM: 143 mmol/L (ref 135–145)

## 2019-06-27 LAB — CALCIUM: Calcium:MCnc:Pt:Ser/Plas:Qn:: 8.8

## 2019-06-27 LAB — BILIRUBIN TOTAL: Bilirubin:MCnc:Pt:Ser/Plas:Qn:: 0.3

## 2019-06-29 LAB — VITAMIN D, TOTAL (25OH): Lab: 26.5

## 2019-06-30 NOTE — Unmapped (Signed)
Reminder Call left voicemail

## 2019-07-03 ENCOUNTER — Ambulatory Visit: Admit: 2019-07-03 | Discharge: 2019-07-04 | Payer: MEDICARE

## 2019-07-03 NOTE — Unmapped (Signed)
Caprice Beaver, M.D.  [facility] Mohs Surgery  SUTURED WOUND / SKIN GRAFT CARE    ? Avoid all strenuous activity for one week. You may resume moderate activity in 48 hours.     ? If site is on your upper body, plan to sleep with your head elevated. Avoid bending over, as this can cause bleeding to occur.    ? Take Tylenol or your prescribed medication as needed for discomfort. Do NOT take both (do not exceed two 325mg  tablets of Tylenol every 4 hours).    ? No alcoholic beverages for 48 hours.    ? Avoid smoking, as it is detrimental to wound healing.    ? Keep the WHITE pressure bandage on for 24 hours; this helps prevent bleeding. If the bandage becomes blood-tinged or loose, reinforce it with gauze and tape. Remove after 24 hours.    ? Leave the flat, inner BROWN bandage in place for one week or until you come in for your follow up appointment. If the bandage becomes blood-tinged or loose, reinforce it with gauze and tape.    ? If active bleeding occurs:  1. Use tightly rolled up gauze or cloth to apply direct pressure over the bandage for 20 minutes.  2. Reapply pressure for an additional 20 minutes if necessary.  3. If two rounds of pressure fail to stop the bleeding, call [phone] during office hours. For after hours, call [phone] or go to the nearest emergency room.  4. Use additional gauze and tape to maintain pressure once the bleeding has stopped.    ? Keep the bandages dry.     ? Wound Healing:  1. It is normal to have swelling and bruising around the surgical site. The bruising will fade in approximately 10-14 days. Elevate the area to reduce swelling.  2. Numbness, itchiness and sensitivity to temperature changes can occur after surgery and may take up to 18 months to normalize.  3. Although wounds may appear healed within a week, it takes at least 3 months for complete healing and 12 months for a scar to take its final appearance. 4. Post-operative pain should slowly get better, beginning the evening after surgery.  5. A sudden or severe increase in pain may indicate a problem. Call our office if this occurs.  6. For skin grafts, avoid prolonged exposure to extremely cold temperatures for 3 weeks.    For any questions or concerns, call the Mohs clinic: [phone]  For after-hours emergencies, call Dr. Merritt???s cell phone: [phone]            PAIN CONTROL: We recommend taking non-prescription pain control medications for any post-surgical discomfort you may have. If you are able to take acetaminophen (Tylenol) and ibuprofen (Motrin), a combination that works well is to take two regular strength ibuprofen (400 mg  Total) and then 2 hours later take one regular strength acetaminophen (325 mg total). Then as you need to take additional doses, continue to alternate every 3 hours. You should be repeating each medicine about 6 hours after the last dose.      Keep well hydrated after your surgery and please do not to exceed 3,000 mg of acetaminophen during a 24 hour period.  Keep in mind that acetaminophen can also be found in many over-the-counter cold medications as well as prescription strength pain medications. If you are taking or have been given a prescription pain medication that contains acetaminophen (Norco, Vicodin, Percocet, among others) do not  take additional acetaminophen by itself.          ONE WEEK AFTER SURGERY    ? Begin daily wound care as follows:  1. Clean the area with soapy water using a Q-tip or gauze pad (shower/bathe normally).  2. Dry the wound with a Q-tip or gauze pad.  3. Apply Vaseline, Polysporin or Bacitracin ointment with a Q-tip.   * Do NOT use Neosporin ointment *  4. Cover the wound with a Band-Aid or non-stick gauze pad and paper tape.  5. Repeat wound care once a day for one week.     ? After one week, continue to apply ointment until fully healed. ? You may resume your regular skin care routine, including applying moisturizer, make-up and sunscreen.    ? Massaging the area will help the scar soften and fade more quickly. Begin to massage the area one month after the bandages have been removed. To massage, apply pressure directly and firmly over the scar with the fingertips and move in a circular motion. Massage the area several times a day for a few minutes at a time.    ? Wound Healing:  1. Once the bandages are removed, the scar will be red and firm (especially in the lip/chin area). This is normal and will fade in time. It might take 6-12 months for this to happen.  2. Approximately 6-8 weeks after surgery, it is not uncommon to see the formation of ???tender, pimple like??? bumps along the scar. As the scar continues to mature and the stitches underneath the skin begin to dissolve, this might occur. Do not pick or squeeze the bumps; they will resolve on their own. Should one break open, producing a small amount of drainage, apply Polysporin or Bacitracin ointment a few times a day until the wound is completely healed.  3. Numbness in the surgical area is expected. It might take 12-18 months for sensation to return to normal. During this time, itchiness, tingling, and occasional sharp pains might occur. These sensations are normal and will subside once the nerves have completely healed.    For any questions or concerns, call the Mohs Clinic: [phone]  For after-hours emergencies, call Dr. Merritt???s cell: [phone]

## 2019-07-04 NOTE — Unmapped (Signed)
ASSESSMENT AND PLAN:     1) SCC of the right thigh treated via Mohs surgery today with primary closure for wound management   2) Diagnosis, etiology, natural history, and treatment options discussed at length, emphasizing the importance of photoprotection.   3) Actinic damage-sun protection measures discussed/advised.  4) Prognosis and future surveillance discussed.  5) Letter with treatment outcome sent to referring provider.  6) Inflamed SK on the right upper arm and AK on the right forearm-both treated with cryotherapy today    INDICATION FOR MOHS SURGERY: location, size, histology, or recurrence    STAGE I: For the right thigh, timeout performed at 9:26 a.m. Informed consent obtained. Anesthesia achieved with 1% lidocaine with 1:100,000 epinephrine. ChloraPrep applied. Two section(s) excised using Mohs technique. Frozen section analysis revealed a negative margin.      Options for management of the wound were discussed. Given the location and size/depth of the wound, Primary repair planned.  Burow's triangles planned to follow relaxed skin tension lines.  Anesthesia achieved, chloraprep applied and a sterile drape placed.  Burow's triangles were excised.  Undermining carried out minimally in a subcutaneous plane to facilitate tension-free closure.  Layered closure completed using 4.0 Monocryl for buried vertical mattress sutures followed by 6.0 fast absorbing gut in a running cuticular technique.    Final size of the defect: 3.1 cm   Final length of the repair: 7.8 cm    Bandage applied and wound management discussed.    RTC in 1 week. HISTORY OF PRESENT ILLNESS: Ms. Rosalyn Gess is seen in consultation at the request of Dr. Orma Flaming  for biopsy-proven SCC.   She notes that the area has been present for about several months  increasing in size with tenderness prior to the biopsy .  There is no history of previous treatment.  She also asks about a persistent scaly plaque on the right upper arm and a new scaly papule on the right forearm, present for several weeks and tender.  Reports no other new or changing lesions and has no other complaints today.    ALLERGIES: reviewed  MEDICATIONS: reviewed    PAST HISTORY: reviewed  FAMILY HISTORY: reviewed    SOCIAL HISTORY: nonsmoker    REVIEW OF SYSTEMS: No fevers, chills, weight loss, lymphadenopathy or other skin concerns.    PHYSICAL EXAMINATION:  GENERAL: A well-appearing woman in no acute distress, alert, appropriately oriented and interactive.  SKIN: Examination of the face, eyelids, lips, neck, scalp notable for a 1.9 cm healing biopsy site on the right thigh, a scaly thin plaque on the right upper arm and a scaly papule on the right forearm. There are rhytids, telangiectasias, and lentigines, consistent with photodamage.    Biopsy report(s) reviewed, confirming the diagnosis.

## 2019-07-21 ENCOUNTER — Other Ambulatory Visit: Payer: Self-pay

## 2019-07-21 DIAGNOSIS — Z20822 Contact with and (suspected) exposure to covid-19: Secondary | ICD-10-CM

## 2019-07-24 LAB — NOVEL CORONAVIRUS, NAA: SARS-CoV-2, NAA: NOT DETECTED

## 2019-09-19 NOTE — Unmapped (Signed)
As requested, the patient has returned my call and I have reviewed with her the following:    ?? 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    ?? A follow-up clinic visit with Dr. Claude Manges is tentatively scheduled for Tuesday, September 26, 2019 that will include same-day repeat blood work.    Otherwise, the patient expressed verbal understanding and had no other questions or concerns at this time and is agreeable to the plan outlined above.  She is to call the clinic back sooner should she have any other issues.    No other actions taken and Dr. Claude Manges is aware.

## 2019-09-19 NOTE — Unmapped (Signed)
Telephone message received from the patient inquiring if a follow-up visit with Dr. Claude Manges can be scheduled.    I have contacted the patient and left a message for her to return my call to address her scheduling concern.    Upon receiving a return phone call, an addendum admitted this encounter to reflect any relevant updates to the patient's plan of care.

## 2019-09-20 NOTE — Unmapped (Addendum)
Followup Visit Note    Patient Name: Shelly Cooper  Patient Age: 79 y.o.  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 Inflammatory arthritis; h/o MTX, h/o Arava; s/p Azulfidine/Plaquenil; s/p Embrel; on Orencia (abatacept)  3. 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 Thigh, bilateral calf  (possibly exacerbated by Embrel).         -- 05/26/19: SqCCa R thigh; s/p Mohs  3. OHD: s/p MI, s/p stents; carotid dopplers 01/17/15 50% obstruction       -- 09/16/16: Pericarditis  4. GERD  5. HTN  6. Hypokalemia and borderline creatinine  7.  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  MD, and cataracts:  s/p removal  9. Routine health care: mammogram WNL 08/27/17.  IFOBT negative 09/2015, 2019  10.  Bilateral adrenal nodules, likely benign; Stable 2015-2018 on CT  11.  Osteopenia/Osteoporosis        -- 06/16/2019: DEXA: T-scores: L1-L4, -2.9; left femur, -1.3; femoral neck, -1.5.  Measurements have decreased significantly since prior study.    Addenda 10/23/19: CT AP on 10/21/19 shows: No dilated or thick walled loops of bowel. Mural thickening of the sigmoid colon with extensive diverticulosis. At least one probable inflamed diverticulum is identified. Pericolonic inflammatory fat stranding. No intramural abscess.  Also, DJD of the spine.  Will discuss with the patient.        Plan:  - The patient appears to be doing well without clinical evidence of progressive myeloma.  Marker proteins are pending.  Assuming these are stable, the patient will be scheduled to return in 12 months, although we have also discussed leaving the responsibility for checking these proteins with her PCP, with referral back if there is significant change.  -- Etiology of the epigastric pain is uncertain.  If it does not respond completely to simple measures, will recommend EGD  --The patient has aged out for colonoscopies, but will continue every other year FIT test; kit given today      Following instructions were given to the patient:  --Try adding a liquid antacid (such as Maalox or Mylanta, 2 tablespoons every 4 hours as needed) to Prevacid, to see if this relieves your stomach pain.  Also, hold on calcium supplements for the next 2 weeks, to see if this helps as well.  --If your pain is not improved, or recurs promptly after stopping use of liquid antacid or restarting calcium, let me know, as I would like to arrange for upper endoscopy.  --Please collect stool specimen, date the tube, and send back.  --I will contact you if the remaining lab studies are not acceptable.  Likewise, if the mammogram is not acceptable.  Otherwise, we will plan to schedule a 1 year follow-up with a member of the myeloma treatment group.  --If you reconsider, and would like to try an oral agent to help strengthen your bones, such as ibandronate (Boniva, taken monthly) or alendronate (Fosamax, taken weekly),, or want to consider one of the injected (Prolia) or infused (Reclast) agents, I recommend having clearance from your dentist prior to starting.  It would be best if your rheumatologist took care of  writing those orders, but if you need a prescription, you feel free to contact this clinic.  --   10/05/2019 Addenda:  FIT positive.  SPEP/IFE/FLC ratio stable.  Discussed options and will pursue Cologuard test.      I have reviewed the laboratory, pathology, and radiology reports in detail and discussed findings with the patient.    Interval History:  Patient returns for scheduled follow-up.  Patient was last seen in September 2019.   She continues to feel generally well.  For the last 2 weeks however she has developed pain in her epigastric region.  When she first eats the pain is alleviated, but within minutes is more severe, is accompanied by increased stomach sounds.  In October 2020, she had a bone densitometry which showed osteoporosis in the spine and osteopenia in the femur and hip.  She started on calcium carbonate and vitamin D.  The vitamin D level was borderline low at 27 prior to starting.  Since we last saw her, she is transition from Plaquenil to [facility] for management of her rheumatoid arthritis; she has been on that medication for about a year.  She has not had follow-up mammography, due to Covid.  It was noted that a 1 year follow-up was recommended by Dr. Margarette Asal, who evaluated her thyroid nodules in 2015.  As nodules do not appear, to her, to be growing (or even be palpable) she has not had follow-up.  She denies other bone pain, loss of appetite, swollen glands, numbness or tingling, bowel or bladder dysfunction, bleeding, or infections.  Her hearing has been diminishing and she plans to get hearing aids.  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        Oncology History    No history exists.       The following portions of the patient's history were reviewed and updated as appropriate: current medications and problem list.    Review of Systems   All other systems reviewed and are negative.      Vital signs for this encounter:  BSA: 1.95 meters squared  BP 179/75  - Pulse 75  - Temp 36.2 ??C (97.2 ??F) (Temporal)  - Resp 18  - Wt 84.3 kg (185 lb 13.6 oz)  - SpO2 95%  - BMI 31.88 kg/m??     Physical Exam   Constitutional: She is oriented to person, place, and time. She appears well-developed and well-nourished. No distress.   HENT:   Head: Normocephalic.   Mouth/Throat: Oropharynx is clear and moist.   Eyes: Pupils are equal, round, and reactive to light. EOM are normal.   Neck: No JVD present. No thyromegaly present.   Cardiovascular: Normal rate, regular rhythm and normal heart sounds.   Pulmonary/Chest: Effort normal and breath sounds normal.   Breast: Deferred.  Followed elsewhere.   Abdominal: Soft. Bowel sounds are normal. She exhibits no distension and no mass. There is no abdominal tenderness.   Neurological: She is alert and oriented to person, place, and time. No cranial nerve deficit.   Skin: No rash noted.   Actinic changes, multiple freckles.   Psychiatric: She has a normal mood and affect. Her behavior is normal.       Karnofsky/Lansky Performance Status  90,  Able to carry on normal activity; minor signs or symptoms of disease (ECOG equivalent 0)     Results:    WBC  Date Value Ref Range Status   09/26/2019 7.0 3.5 - 10.5 10*9/L Final   10/30/2014 9.8 4.5 - 11.0 10*9/L Final     HGB   Date Value Ref Range Status   09/26/2019 13.7 12.0 - 15.5 g/dL Final   29/56/2130 86.5 12.0 - 16.0 g/dL Final     HCT   Date Value Ref Range Status   09/26/2019 41.2 35.0 - 44.0 % Final   10/30/2014 42.9 36.0 - 46.0 % Final     Platelet   Date Value Ref Range Status   09/26/2019 268 150 - 450 10*9/L Final   10/30/2014 312 150 - 440 10*9/L Final     Creatinine   Date Value Ref Range Status   09/26/2019 1.02 (H) 0.60 - 1.00 mg/dL Final   78/46/9629 5.28 0.60 - 1.00 mg/dL Final     AST   Date Value Ref Range Status   06/27/2019 30 14 - 38 U/L Final   10/30/2014 25 14 - 38 U/L Final

## 2019-09-26 ENCOUNTER — Ambulatory Visit: Admit: 2019-09-26 | Discharge: 2019-09-27 | Payer: MEDICARE

## 2019-09-26 ENCOUNTER — Encounter
Admit: 2019-09-26 | Discharge: 2019-09-27 | Payer: MEDICARE | Attending: Hematology & Oncology | Primary: Hematology & Oncology

## 2019-09-26 DIAGNOSIS — D472 Monoclonal gammopathy: Principal | ICD-10-CM

## 2019-09-26 DIAGNOSIS — Z1231 Encounter for screening mammogram for malignant neoplasm of breast: Principal | ICD-10-CM

## 2019-09-26 LAB — CBC W/ AUTO DIFF
BASOPHILS ABSOLUTE COUNT: 0 10*9/L (ref 0.0–0.1)
BASOPHILS RELATIVE PERCENT: 0.6 %
EOSINOPHILS ABSOLUTE COUNT: 0.3 10*9/L (ref 0.0–0.7)
EOSINOPHILS RELATIVE PERCENT: 4.4 %
HEMATOCRIT: 41.2 % (ref 35.0–44.0)
HEMOGLOBIN: 13.7 g/dL (ref 12.0–15.5)
LYMPHOCYTES ABSOLUTE COUNT: 2 10*9/L (ref 0.7–4.0)
LYMPHOCYTES RELATIVE PERCENT: 28.6 %
MEAN CORPUSCULAR HEMOGLOBIN CONC: 33.4 g/dL (ref 30.0–36.0)
MEAN CORPUSCULAR VOLUME: 89.7 fL (ref 82.0–98.0)
MEAN PLATELET VOLUME: 7.6 fL (ref 7.0–10.0)
MONOCYTES ABSOLUTE COUNT: 0.5 10*9/L (ref 0.1–1.0)
MONOCYTES RELATIVE PERCENT: 7.4 %
NEUTROPHILS RELATIVE PERCENT: 59 %
PLATELET COUNT: 268 10*9/L (ref 150–450)
RED BLOOD CELL COUNT: 4.59 10*12/L (ref 3.90–5.03)
RED CELL DISTRIBUTION WIDTH: 13.6 % (ref 12.0–15.0)
WBC ADJUSTED: 7 10*9/L (ref 3.5–10.5)

## 2019-09-26 LAB — MYELOMA SERUM CHEMISTRIES
GAMMAGLOBULIN; IGG: 707 mg/dL (ref 600–1700)
GAMMAGLOBULIN; IGM: 43 mg/dL (ref 35–290)
PROTEIN TOTAL: 6.6 g/dL (ref 6.5–8.3)

## 2019-09-26 LAB — CREATININE: Creatinine:MCnc:Pt:Ser/Plas:Qn:: 1.02 — ABNORMAL HIGH

## 2019-09-26 LAB — GAMMAGLOBULIN; IGM: IgM:MCnc:Pt:Ser/Plas:Qn:: 43

## 2019-09-26 LAB — NEUTROPHILS ABSOLUTE COUNT: Neutrophils:NCnc:Pt:Bld:Qn:Automated count: 4.1

## 2019-09-26 LAB — CALCIUM: Calcium:MCnc:Pt:Ser/Plas:Qn:: 9.7

## 2019-09-26 NOTE — Unmapped (Signed)
Mrs. Shelly Cooper here for followup with Dr. Claude Manges.     Weight: Stable  Energy Level/Fatigue/Active Level: I'm ok. - pt concerned about stomach - pt reports upset stomach 2 wks ago and has had bad reflux and bloating since, and a burning sensation  Elimination/Discharge: No issues reported  Nausea/Vomitting: None  Appetite/Diet: Adequate - burning sensation feels better after she eats, at least for awhile  Pain/Swelling: No pain right now  Skin: Intact    Chemo: NA  Psychosocial: No issues/needs  Refills of Meds: None    Notified Dr. Claude Manges.

## 2019-09-26 NOTE — Unmapped (Addendum)
--  Try adding a liquid antacid (such as Maalox or Mylanta, 2 tablespoons every 4 hours as needed) to Prevacid, to see if this relieves your stomach pain.  Also, hold on calcium supplements for the next 2 weeks, to see if this helps as well.  --If your pain is not improved, or recurs promptly after stopping use of liquid antacid or restarting calcium, let me know, as I would like to arrange for upper endoscopy.  --Please collect stool specimen, date the tube, and send back.  --I will contact you if the remaining lab studies are not acceptable.  Likewise, if the mammogram is not acceptable.  Otherwise, we will plan to schedule a 1 year follow-up with a member of the myeloma treatment group.  --If you reconsider, and would like to try an oral agent to help strengthen your bones, such as ibandronate (Boniva, taken monthly) or alendronate (Fosamax, taken weekly),, or want to consider one of the injected (Prolia) or infused (Reclast) agents, I recommend having clearance from your dentist prior to starting.  It would be best if your rheumatologist took care of writing those orders, but if you need a prescription, you feel free to contact this clinic.  --

## 2019-09-27 LAB — IMMUNOGLOBULIN FREE LT CHAINS BLOOD: KAPPA FREE,SERUM: 2.56 mg/dL — ABNORMAL HIGH (ref 0.33–1.94)

## 2019-09-27 LAB — LAMBDA FREE, SER: Lab: 2.56

## 2019-09-28 LAB — ALBUMIN (SPE): Lab: 4

## 2019-09-28 LAB — MYELOMA WORKUP, SERUM
ALPHA-1 GLOBULIN: 0.3 g/dL (ref 0.2–0.5)
ALPHA-2 GLOBULIN: 0.8 g/dL (ref 0.5–1.1)
BETA-1 GLOBULIN: 0.4 g/dL (ref 0.3–0.6)
BETA-2 GLOBULIN: 0.3 g/dL (ref 0.2–0.6)
GAMMAGLOBULIN: 0.6 g/dL (ref 0.5–1.5)

## 2019-10-03 ENCOUNTER — Encounter: Admit: 2019-10-03 | Discharge: 2019-10-03 | Payer: MEDICARE

## 2019-10-03 ENCOUNTER — Encounter
Admit: 2019-10-03 | Discharge: 2019-10-03 | Payer: MEDICARE | Attending: Hematology & Oncology | Primary: Hematology & Oncology

## 2019-10-05 NOTE — Unmapped (Signed)
Contacted patient to discuss positive FIT.  No hemorrhoids.  No h/o polyps.    Will arrange for Cologuard

## 2019-10-06 NOTE — Unmapped (Signed)
As directed by Dr. Claude Manges, I have contacted the patient and left a message for her to return my call to review the process associated with ordering the Cologuard testing kit.    ?? It is anticipated our office will submit a signed physician order for Cologuard testing to [employer] who can then process the request and ship the collection kit to the patient.    Upon the patient returning my call, an addendum will make this encounter to reflect the information listed above is been reviewed.    No other actions taken.

## 2019-10-06 NOTE — Unmapped (Signed)
As requested, the patient is returned my call and we have reviewed the plan of care as highlighted in the prior note associated with this encounter.    ?? The patient continues to be on board with and interested in pursuing Cologuard testing as recommended by Dr. Claude Manges.    ?? It is anticipated the completed Cologuard order form will be submitted to [facility] on either Monday or Tuesday of next week.    ?? The patient is to contact the clinic back by Thursday or Friday of next week should she not hear from a representative from [employer].    Otherwise, the patient expressed verbal understanding and had no other questions or concerns at this time is agreeable to the plan outlined above.  She is to call the clinic back sooner should she have any other issues.    No other actions taken.

## 2019-10-09 NOTE — Unmapped (Signed)
??   The Cologuard requisition order and patient demographic information have been successfully sent to [employer] via facsimile.    ?? The original version of the facsimile confirmation page indicating that the sent documents were successfully received and the original order are both archived in the media section of the electronic medical record.    ?? It is anticipated that [facility] will reach out to the patient to coordinate delivery of the collection kit and our office will be notified of the appropriate times during specimen collection and the resulting process.    No other actions taken at this time.

## 2019-10-11 NOTE — Unmapped (Signed)
The clinic has received notification from [employer] indicating the following:    ?? Additional documentation is needed to obtain insurance authorization to perform Cologuard testing.    ?? The most recent clinic note with Dr. Claude Manges dated 09/26/2019 along with the official results of the FIT testing has been shared with [employer] via facsimile.  ?? The original version of the facsimile confirmation page indicating that the above documents were successfully received is archived in the media section of the electronic medical record.    ?? In addition, I have contacted the patient and notified her that the actions listed above have been completed or are in progress.    Otherwise, the patient expressed verbal understanding and had no other questions or concerns at this time and is to contact our office back sooner should she have any other issues.    No other actions taken and Dr. Claude Manges is aware.

## 2019-10-13 ENCOUNTER — Encounter: Admit: 2019-10-13 | Discharge: 2019-10-14 | Payer: MEDICARE

## 2019-10-13 DIAGNOSIS — L570 Actinic keratosis: Principal | ICD-10-CM

## 2019-10-13 DIAGNOSIS — Z85828 Personal history of other malignant neoplasm of skin: Principal | ICD-10-CM

## 2019-10-13 NOTE — Unmapped (Signed)
Amlactin or Eucerin roughness relief spot treatment for the scar.    Cryosurgery  Cryosurgery (???freezing???) uses liquid nitrogen to destroy certain types of skin lesions. Lowering the temperature of the lesion in a small area surrounding skin destroys the lesion. Immediately following cryosurgery, you will notice redness and swelling of the treatment area. Blistering or weeping may occur, lasting approximately one week which will then be followed by crusting. Most areas will heal completely in 10 to 14 days.    Wash the treated areas daily. Allow soap and water to run over the areas, but do not scrub. Should a scab or crust form, allow it to fall off on its own. Do not remove or pick at it. Application of an ointment  and a bandage may make you feel more comfortable, but it is not necessary. Some people develop an allergy to Neosporin, so we recommend that Vaseline or  Aquaphor be used.    The cryotherapy site will be more sensitive than your surrounding skin. Keep it covered, and remember to apply sunscreen every day to all your sun exposed skin. A scar may remain which is lighter or pinker than your normal skin. Your body will continue to improve your scar for up to one year; however a light-colored scar may remain.    Infection following cryotherapy is rare. However if you are worried about the appearance of the treated area, contact your doctor. We have a physician on call at all times. If you have any concerns about the site, please call our clinic at [phone]

## 2019-10-13 NOTE — Unmapped (Signed)
Dermatology Note     Assessment and Plan:      Skin Check/Evaluation of Benign Lesions/ findings:    Seborrheic Keratosis (es)- no irritation noted   - Reassurance provided regarding the benign appearance of lesions noted on exam today; no treatment is indicated in the absence of symptoms/changes.  - Patient education provided.  - Encouraged regular self-skin monitoring as well as routine clinical examinations for surveillance.  - Reinforced importance of photoprotective strategies including liberal and frequent sunscreen use of SPF 30 or greater, use of protective clothing, and sun avoidance for prevention of cutaneous malignancy and photoaging.    Actinic Keratoses, some hypertrophic: After R/B/A discussed (including scarring, pigment alteration, recurrence, or persistence of the lesion) and verbal consent was obtained, identified  lesions were treated with cryotherapy x 1 ten second freeze-thaw cycle. The patient tolerated the procedure well and was instructed on post-cryotherapy wound care.   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      - Counseled patient on the importance of regular self-monitoring as well as clinical skin examinations as scheduled.     Advised she come for more frequent visits given how diffuse and extensive her sun damage is.    The patient was advised to call for an appointment should any new, changing ,or symptomatic lesion develop.     RTC: No follow-ups on file. or sooner as needed   _____________________________________________________________________________________    Referring Provider: Referred Self     Chief Complaint     Chief Complaint   Patient presents with   ??? Skin Check     FBSE, pt c/o multiple areas of concern pt states that she'll point them out to provider        HPI     Shelly Cooper is a pleasant 79 y.o. female, who presents as a returning patient (last seen 07/03/2019) to [facility] for a full body skin exam for evaluation of lesion(s) .      At our last visit we biopsied a lesion on her right thigh that showed SCC.  This was treated by Mohs with Dr. Lorn Junes.  We also treated AKs on the arms with cryotherapy and dermatitis of the left leg with lidex ointment.    Today she notes several scaly spots over her chest and legs that are asymptomatic.  Some are so scaly the fall off and then regrow.    The patient denies any other new or changing lesions or areas of concern.     Pertinent Past Medical History     History of skin cancer as outlined below:  Numerous  Most recent SCC right thigh, s/p Mohs on 07/03/19  Had had blue light in the past    Past Medical History, Family History, Social History , Med List, Allergies, Problem List reviewed in the rooming section of Epic     ROS: Other than symptoms mentioned in the HPI,   No fevers, chills, or other skin complaints.    Physical Examination     General: Well-appearing female, in no acute distress, resting comfortably  Neuro: Alert and oriented, answers questions appropriately  Full Skin: Exam of the scalp, face, eyelids, lips, nose, ears, neck, chest, abdomen, back, arms, legs, hands, feet, palms, soles, nails, buttocks was performed.  Skin examination was notable for  the following:  -Scaly erythematous macules on the sun exposed areas of chest, bilateral upper and lower extremities  -Multiple stuck-on appearing keratotic papules located on the trunk and extremities without irritation (redness, crusting, edema, and /or partial avulsion)   -Well healed scar on the right thigh without nodularity, re-pigmentation,  scaling, erythema, or regrowth  All areas not commented on are within normal limits or unremarkable.

## 2019-10-19 NOTE — Unmapped (Signed)
Shelly Cooper contacted the [employer] requesting to speak with the care team of Shelly Cooper to discuss:    Patient called to speak to [name] about getting a color guard test.    Please contact [name] at [phone].        Check Indicates criteria has been reviewed and confirmed with the patient:    [x]  Preferred Name   [x]  DOB and/or MR#  [x]  Preferred Contact Method  [x]  Phone Number(s)   []  MyChart     Thank you,   Jacques Navy  [facility]   [phone]

## 2019-10-20 NOTE — Unmapped (Addendum)
As requested, I returned the patient's call to address with her the following:    ?? [name] has been experiencing noticeably worse abdominal pain since initially evaluated by Dr. Claude Manges on 09/26/2019.  ?? The abdominal pain/discomfort is specific to both lower quadrants of the abdomen.  ?? The pain is described as stabbing and radiates into the patient's rectum from the lower abdomen.  ?? The patient continues to experience excessive amounts of bloating in conjunction with overactive bowel sounds.  ?? [name] has been experiencing more episodes of liquid stool that are alternating with episodes of constipation.  ?? In addition she continues to experience frequent episodes of burping and signs symptoms of indigestion.    ?? The patient's clinic visit with Dr. Claude Manges on 09/26/2019 involved suggestions which she has utilized without significant improvement such as:  ?? Utilizing a liquid antacid such as Mylanta.  She reports using the whole bottle with minimal to no significant impact in her symptoms.  ?? Unfortunately, the patient has not yet received the Cologuard collection kit due to issues with insurance approval.    ?? Patient has confirmed she received her second dose of the COVID-19 immunization on 10/17/2019 which may or may not have been a contributing factor for the episode of fever she is recently experienced yesterday that is presently resolved.    These concerns been reviewed by Dr. Claude Manges the following plan is now in place:    ?? Orders have been placed for a CT with contrast of the abdomen pelvis to be completed urgently.  ?? A referral request has been submitted to Dr. Lacey Jensen of [facility] (V: [phone]: [phone])  ?? The original version of the facsimile confirmation page indicating that the referral order has been received is archived in the media section of the electronic medical record.  The included documents are:  ?? Copy of the referral to Dr. Lacey Jensen  ?? Medication/allergy list  ?? Facesheet  ?? Laboratory values  ?? Clinic note with Dr. Claude Manges dated 09/26/2019    ?? An addendum admitted this encounter to reflect any other relevant updates to the patient's plan of care.    In addition, I have spoken with the patient's husband and notified him of the actions noted above and that additional updates will be forthcoming.

## 2019-10-20 NOTE — Unmapped (Signed)
Addended by: Colletta Maryland A on: 10/20/2019 03:05 PM     Modules accepted: Orders

## 2019-10-21 ENCOUNTER — Ambulatory Visit: Admit: 2019-10-21 | Discharge: 2019-10-22 | Payer: MEDICARE

## 2019-10-21 NOTE — Unmapped (Signed)
I contacted patient to notify her of CT appointment scheduled for 10/21/19 @ 2:30 in [place].  Patient confirmed appointment.

## 2019-10-23 NOTE — Unmapped (Signed)
The clinic has received phone call from [facility] requesting the following:    ?? Copy of the CT report dated 10/21/2019 has been shared with there is no refill request for the patient to be evaluated at [facility].  ?? The facsimile confirmation page indicating that the sent documents were successfully received is archived in the media section of the electronic medical record.    ?? I have spoken with Berton Mount, the intake coordinator at [facility] (V: [phone]; EXT.  204/F: [phone]) and was able to confirm that the documents noted above have been received and that their team anticipates reaching out to the patient this week to coordinate a new patient consultation.    No other actions taken at this time and Dr. Claude Manges is aware.

## 2019-10-23 NOTE — Unmapped (Signed)
Called patient to discuss the CT report, that suggests diverticular disease.  She did have significant lower abdominal discomfort, poorly localized but below the navel, and one day of fever.  In the last several days her symptoms have significantly improved.  Advised to do Cologuard test (guaiac positive stool), and contact us if pain or fever, or other symptoms develop, as would plan course of ATB for diverticulitis, and referral to GI for consideration of colonoscopy.

## 2019-11-01 NOTE — Unmapped (Signed)
Our office has received notification from [employer] to clarify the following:    ?? [name] has declined pursuing Cologuard testing as ordered by Dr. Claude Manges.    ?? The original version of the received correspondence highlighting this update is archived in the media section of the electronic medical record.    ?? A message was left for the patient to call the clinic back should she need to address any concerns pertaining to the canceled Cologuard testing as it is anticipated her care will be managed by a  local gastroenterologist with possible colonoscopy to be performed in the near future.    This update is been shared with Dr. Claude Manges and no other actions are indicated at this time.

## 2019-11-14 NOTE — Unmapped (Addendum)
Clinic note received from [facility] and is archived in the media section of the electronic medical record.    This update has been shared with Dr. Claude Manges as well.

## 2019-12-22 ENCOUNTER — Encounter: Admit: 2019-12-22 | Discharge: 2019-12-23 | Payer: MEDICARE

## 2019-12-22 DIAGNOSIS — L814 Other melanin hyperpigmentation: Principal | ICD-10-CM

## 2019-12-22 DIAGNOSIS — Z85828 Personal history of other malignant neoplasm of skin: Principal | ICD-10-CM

## 2019-12-22 DIAGNOSIS — L578 Other skin changes due to chronic exposure to nonionizing radiation: Principal | ICD-10-CM

## 2019-12-22 DIAGNOSIS — L570 Actinic keratosis: Principal | ICD-10-CM

## 2019-12-22 DIAGNOSIS — L821 Other seborrheic keratosis: Principal | ICD-10-CM

## 2019-12-22 NOTE — Unmapped (Signed)
Patient Education        Actinic Keratosis: Care Instructions  Your Care Instructions  Actinic keratosis is a skin growth caused by sun damage. It can turn into skin cancer, but this isn't common. Actinic keratoses, also called solar keratoses, are small red, brown, or skin-colored scaly patches. They are most common on the face, neck, hands, and forearms.  Your doctor can remove these growths by freezing or scraping them off or by putting medicines on them.  Follow-up care is a key part of your treatment and safety. Be sure to make and go to all appointments, and call your doctor if you are having problems. It's also a good idea to know your test results and keep a list of the medicines you take.  How can you care for yourself at home?  ?? If your doctor told you how to care for the treated area, follow your doctor's instructions. If you did not get instructions, follow this general advice:  ? Wash around the area with clean water 2 times a day. Don't use hydrogen peroxide or alcohol, which can slow healing.  ? You may cover the area with a thin layer of petroleum jelly, such as Vaseline, and a nonstick bandage.  ? Apply more petroleum jelly and replace the bandage as needed.  To prevent actinic keratosis  ?? Always wear sunscreen on exposed skin. Make sure to use a broad-spectrum sunscreen that has a sun protection factor (SPF) of 30 or higher. Use it every day, even when it is cloudy.  ?? Wear long sleeves, a hat, and pants if you are going to be outdoors for a long time.  ?? Avoid the sun between 10 a.m. and 4 p.m., the peak time for UV rays.  ?? Do not use tanning booths or sunlamps.  When should you call for help?  Watch closely for changes in your health, and be sure to contact your doctor if:  ?? ?? You have symptoms of infection, such as:  ? Increased pain, swelling, warmth, or redness.  ? Red streaks leading from the area.  ? Pus draining from the area.  ? A fever.   Where can you learn more?  Go to [facility] at https://myuncchart.org  Select Patient Education under [employer]. Enter L364 in the search box to learn more about Actinic Keratosis: Care Instructions.  Current as of: March 02, 2019??????????????????????????????Content Version: 12.8  ?? 2006-2021 [employer].   Care instructions adapted under license by [facility]. If you have questions about a medical condition or this instruction, always ask your healthcare professional. [employer], Inc[employer] disclaims any warranty or liability for your use of this information.         Patient Education        Seborrheic Keratosis: Care Instructions  Your Care Instructions  Seborrheic keratoses are raised skin growths that look scaly or warty. They usually look like they were stuck onto the skin. They most often grow in groups on the back or chest and are more common in older people. A seborrheic keratosis can be tan or dark brown. A seborrheic keratosis is not a mole and is almost always harmless. But it is still a good idea to check your skin regularly.  Sometimes a seborrheic keratosis can itch. Scratching it can cause it to bleed and sometimes even scar.  A seborrheic keratosis is removed only if it bothers you. The doctor will freeze it or scrape it off with a tool. The doctor can  also use a laser to remove a seborrheic keratosis. Treatment usually results in normal-looking skin, but it can leave a light or dark mark or even a scar on the skin.  Follow-up care is a key part of your treatment and safety. Be sure to make and go to all appointments, and call your doctor if you are having problems. It's also a good idea to know your test results and keep a list of the medicines you take.  How can you care for yourself at home?  ?? If clothing irritates your seborrheic keratosis, cover it with a bandage to prevent rubbing and bleeding.  ?? If you have a seborrheic keratosis removed, clean the area with soap and water two times a day unless your doctor gives you different instructions. Don't use hydrogen peroxide or alcohol, which can slow healing.  ? You may cover the wound with a thin layer of petroleum jelly, such as Vaseline, and a nonstick bandage.  ?? If you see a change in a skin growth, contact your doctor. Look for:  ? A mole that bleeds.  ? A fast-growing mole.  ? A scaly or crusted growth on the skin.  ? A sore that will not heal.  When should you call for help?   Call your doctor now or seek immediate medical care if:  ?? ?? You have an area of normal skin that suddenly changes in shape, size, or how it looks.   ?? ?? Your skin is badly broken from scratching.   ?? ?? You have signs of infection such as:  ? Pain, warmth, or swelling in your skin.  ? Red streaks near a wound in your skin.  ? Pus coming from a wound in your skin.  ? A fever not due to the flu or other illness.   Watch closely for changes in your health, and be sure to contact your doctor if:  ?? ?? You do not get better as expected.   Where can you learn more?  Go to [facility] at https://myuncchart.org  Select Patient Education under [employer]. Enter [phone] in the search box to learn more about Seborrheic Keratosis: Care Instructions.  Current as of: March 02, 2019??????????????????????????????Content Version: 12.8  ?? 2006-2021 Healthwise, Inc[employer].   Care instructions adapted under license by [facility]. If you have questions about a medical condition or this instruction, always ask your healthcare professional. Healthwise, Inc[employer] disclaims any warranty or liability for your use of this information.         Cryosurgery  Cryosurgery (???freezing???) uses liquid nitrogen to destroy certain types of skin lesions. Lowering the temperature of the lesion in a small area surrounding skin destroys the lesion. Immediately following cryosurgery, you will notice redness and swelling of the treatment area. Blistering or weeping may occur, lasting approximately one week which will then be followed by crusting. Most areas will heal completely in 10 to 14 days.    Wash the treated areas daily. Allow soap and water to run over the areas, but do not scrub. Should a scab or crust form, allow it to fall off on its own. Do not remove or pick at it. Application of an ointment  and a bandage may make you feel more comfortable, but it is not necessary. Some people develop an allergy to Neosporin, so we recommend that Vaseline or  Aquaphor be used.    The cryotherapy site will be more sensitive than your surrounding skin. Keep it covered, and remember  to apply sunscreen every day to all your sun exposed skin. A scar may remain which is lighter or pinker than your normal skin. Your body will continue to improve your scar for up to one year; however a light-colored scar may remain.    Infection following cryotherapy is rare. However if you are worried about the appearance of the treated area, contact your doctor. We have a physician on call at all times. If you have any concerns about the site, please call our clinic at [phone]

## 2019-12-22 NOTE — Unmapped (Signed)
Dermatology Note     Assessment and Plan:      Skin Check/Evaluation of Benign Lesions/ findings:  Lentigo/Lentigines   Seborrheic Keratosis (es)- no irritation noted   - Reassurance provided regarding the benign appearance of lesions noted on exam today; no treatment is indicated in the absence of symptoms/changes.  - Patient education provided.  - Encouraged regular self-skin monitoring as well as routine clinical examinations for surveillance.  - Reinforced importance of photoprotective strategies including liberal and frequent sunscreen use of a broad spectrum SPF 30 or greater, use of protective clothing, and sun avoidance for prevention of cutaneous malignancy and photoaging.    Actinic Keratoses, numerous and many hypertrophic including lesion of concern on right arm: After R/B/A discussed (including scarring, pigment alteration, recurrence, or persistence of the lesion) and verbal consent was obtained, identified  lesions were treated with cryotherapy x 1 ten second freeze-thaw cycle. The patient tolerated the procedure well and was instructed on post-cryotherapy wound care.   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      Will touch base with Mohs to see if suture reactions might persist this long  - Counseled patient on the importance of regular self-monitoring as well as clinical skin examinations as scheduled.     She is at very high risk for developing additional nonmelanoma skin cancers given her extensive actinic damage.  She is agreeable to close follow-up for continued treatment of persistent lesions.  In particular, the lesion on her right forearm should be biopsied if not resolved at next visit.    The patient was advised to call for an appointment should any new, changing ,or symptomatic lesion develop.     RTC: Return in about 6 weeks (around 02/02/2020). or sooner as needed   _____________________________________________________________________________________    Referring Provider: Alonna Minium     Chief Complaint     Chief Complaint   Patient presents with   ??? Skin Check     FBSE   ??? Lesion Of Concern     Spot had cryo last visit is still there       HPI     Shelly Cooper is a pleasant 79 y.o. female, who presents as a returning patient (last seen 10/13/2019) to [facility] for a full body skin exam for evaluation of lesion (s).      At the last visit we treated several hypertrophic AKs on the bilateral upper and lower extremities with cryotherapy and she notes that the lesion on her right arm is still there.  It frequently gets caught on things and is painful.    Of note, given history of extensive sun damage she has tried Efudex cream on the chest with robust reaction.  Has also tried blue light for field treatment.    The patient denies any other new or changing lesions or areas of concern.     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    Physical Examination     General: Well-appearing female, in no acute distress, resting comfortably  Neuro: Alert and oriented, answers questions appropriately  Full Skin Exam: Examination of the scalp, face, eyelids, lips, nose, ears, neck, chest, abdomen, back, arms, legs, hands, feet, palms, soles, nails, buttocks was performed.  Skin examination was notable for the following:  Actinic Keratoses: Numerous scaly erythematous macules on the sun exposed areas of back, chest, bilateral upper extremities and bilateral lower extremities including hypertrophic lesion of the right arm  Surgical Site: Well healed scar on the right thigh with a few tender papules along the suture line   Xerosis diffusely  Lentigines- 6-20 mm pigmented macules that are tan to brown in color and are somewhat non-uniform in shape and concentrated in the sun-exposed areas    Multiple hyperkeratotic, well-demarcated, stuck-on pigmented to skin-colored 6-20 mm papules and plaques scattered predominantly over the trunk and extremities  All areas not commented on are within normal limits or unremarkable.

## 2019-12-25 NOTE — Unmapped (Signed)
The clinic has received correspondence from [employer] with regard to Cologuard testing originally ordered in February 2021 by Dr. Claude Manges.    ?? I have contacted [employer] and notified them that the original Cologuard order is to be CANCELED at this time due to the patient having pursued and completed colonoscopy testing.    ?? In addition, the notification from [employer] is archived in the media section of electronic medical record.    No other actions taken at this time.

## 2020-02-23 ENCOUNTER — Encounter: Admit: 2020-02-23 | Discharge: 2020-02-24 | Payer: MEDICARE

## 2020-02-23 DIAGNOSIS — Z85828 Personal history of other malignant neoplasm of skin: Principal | ICD-10-CM

## 2020-02-23 DIAGNOSIS — L910 Hypertrophic scar: Principal | ICD-10-CM

## 2020-02-23 DIAGNOSIS — L570 Actinic keratosis: Principal | ICD-10-CM

## 2020-02-23 MED ORDER — CLOBETASOL 0.05 % TOPICAL OINTMENT
Freq: Two times a day (BID) | TOPICAL | 1 refills | 0.00000 days | Status: CP
Start: 2020-02-23 — End: 2021-02-22

## 2020-02-23 NOTE — Unmapped (Signed)
Dermatology Note     Assessment and Plan:      Actinic Keratoses, many and some hypertrophic: After R/B/A discussed (including scarring, pigment alteration, recurrence, or persistence of the lesion) and verbal consent was obtained, identified  lesions were treated with cryotherapy x 1 ten second freeze-thaw cycle. The patient tolerated the procedure well and was instructed on post-cryotherapy wound care.   Total AK's frozen:9  Location and number: Right shin x 2, left shin x 2, chest x 1, right arm x 1, right knuckle x 1, left pinky x 1, left cheek x 1  - We agreed to reevaluate at the end of summer and discussed that any lesions that remain following this round of cryotherapy we will need to evaluate for possible biopsy; we discussed treatment options and she would be interested in IL 5FU    Hypertrophic scar, left upper back:   - Associated with itch  - Discussed treatment options including risks and benefits of topical steroids vs ILK  - She would like to try topical steroids; occlusion recommended/encouraged  - START clobetasol ointment under occlusion    Dermatitis (vs bite reaction), left axilla:  - Start clobetasol for a week or less; she knows to call if this rash doesn't resolve    Personal history of non-melanoma skin cancer  numerous  No evidence of recurrence at this time.      - Counseled patient on the importance of regular self-monitoring as well as clinical skin examinations as scheduled.       The patient was advised to call for an appointment should any new, changing ,or symptomatic lesion develop.     RTC: Return in about 2 months (around 04/24/2020). or sooner as needed   _____________________________________________________________________________________    Referring Provider: Alonna Minium     Chief Complaint     Chief Complaint   Patient presents with   ??? Follow-up     recheck spots that were frozen at last visit       HPI     Shelly Cooper is a pleasant 79 y.o. female, who presents as a returning patient (last seen 12/22/2019) to [facility] for follow up of  lesions of concern.     At the last visit we diagnosed lentigines and SKs.  We also froze over 15 AKs some of which were hypertrophic.    She presents today for follow up on these spots and reports that she has several spots that are still thick even after freezing and some are painful when bumped.  She notes several persistent lesions on the bilateral shins, chest, right arm right knuckle, left pinky and left cheek. She also notes an itchy spot on her left upper back - she did have a procedure there in the past.  No treatments tried.  Finally she has a few itchy spots in her left axilla and wonders if they might be bites though she doesn't recall any bug exposures.  She notes she has a lot of summer plans with her grandkids and time at the beach.  She would she prefer to hold off on any procedures until late summer.      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  Has also tried blue light for field treatment.    Past Medical History, Family History, Social History , Med List, Allergies, Problem List reviewed in the rooming section of Epic     ROS: Other than symptoms mentioned in the HPI, No fevers, chills, or other skin complaints.    Physical Examination     General: Well-appearing female, in no acute distress, resting comfortably  Neuro: Alert and oriented, answers questions appropriately  Focal Skin Exam: Per patient request, Focal examination of face, neck, chest, back, bilateral upper and lower extremities performed.  Skin examination was notable for the following:  Actinic Keratoses: Scaly erythematous macules on the sun exposed areas of left cheek, chest, right shin, left shin, right arm, right knuckle, left pinky, many of which are hypertrophic   Left upper back with a firm pink scar  Left axilla with a few scattered pink papules    All areas not commented on are within normal limits or unremarkable.

## 2020-02-23 NOTE — Unmapped (Signed)
Cryosurgery  Cryosurgery (“freezing”) uses liquid nitrogen to destroy certain types of skin lesions. Lowering the temperature of the lesion in a small area surrounding skin destroys the lesion. Immediately following cryosurgery, you will notice redness and swelling of the treatment area. Blistering or weeping may occur, lasting approximately one week which will then be followed by crusting. Most areas will heal completely in 10 to 14 days.    Wash the treated areas daily. Allow soap and water to run over the areas, but do not scrub. Should a scab or crust form, allow it to fall off on its own. Do not remove or pick at it. Application of an ointment  and a bandage may make you feel more comfortable, but it is not necessary. Some people develop an allergy to Neosporin, so we recommend that Vaseline or  Aquaphor be used.    The cryotherapy site will be more sensitive than your surrounding skin. Keep it covered, and remember to apply sunscreen every day to all your sun exposed skin. A scar may remain which is lighter or pinker than your normal skin. Your body will continue to improve your scar for up to one year; however a light-colored scar may remain.    Infection following cryotherapy is rare. However if you are worried about the appearance of the treated area, contact your doctor. We have a physician on call at all times. If you have any concerns about the site, please call our clinic at [phone]

## 2020-03-27 MED ORDER — FAMOTIDINE 20 MG TABLET
0.00000 days
Start: 2020-03-27 — End: ?

## 2020-03-28 ENCOUNTER — Encounter: Admit: 2020-03-28 | Discharge: 2020-03-29 | Payer: MEDICARE

## 2020-03-28 DIAGNOSIS — Z79899 Other long term (current) drug therapy: Principal | ICD-10-CM

## 2020-03-28 DIAGNOSIS — M810 Age-related osteoporosis without current pathological fracture: Principal | ICD-10-CM

## 2020-03-28 DIAGNOSIS — M0600 Rheumatoid arthritis without rheumatoid factor, unspecified site: Principal | ICD-10-CM

## 2020-03-28 MED ORDER — ORENCIA CLICKJECT 125 MG/ML SUBCUTANEOUS AUTO-INJECTOR: 125 mg | mL | 3 refills | 0 days | Status: AC

## 2020-03-28 MED ORDER — ORENCIA CLICKJECT 125 MG/ML SUBCUTANEOUS AUTO-INJECTOR
SUBCUTANEOUS | 3 refills | 0.00000 days | Status: CP
Start: 2020-03-28 — End: 2020-03-28

## 2020-03-28 NOTE — Unmapped (Signed)
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  Pt also has a history of non-melanoma skin cancer but was cleared by her dermatologist to start Enbrel, which was started in November 2016 but discontinued in May 2017 due to worsening squamous cell skin lesions. She was continued hydroxychloroquine and started on Sulfasalazine in May 2017 but we have not been able to titrate dose beyond 500 mg po bid due to cough and concerns of possible side effects. She was last seen by myself in November 2017 following a recent shingles flare with evidence for increased LE edema so no medication adjustments made at that time. She called in December 2017 stating that the sulfasalazine was causing swelling in the face and so advised her to stop. She reported 22lb weight loss after stopping the sulfasalazine. She was then seen by Shelly Pavlov PA-C in February 2018 and had recently been diagnosed with pericarditis and started on colchicine. 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 At follow-up in June 2018, reported tapering off colchicine and medrol dose pack for pleuritis and pericarditis. She reported compliance with hydroxychloroquine 200 mg qd. We had discussed starting Actemra/Tocilizumab for RA with pericarditis and pleuritis and patient finally agreed to start following cardiology approval in October 2018. Due to $500 copay cost following first infusion, patient declined continuing with tocilizumab infusions. She was not interested in pursuing [place] injections with tocilizumab or other therapy. She then was started on tofacitinib, but one week after starting she reported abdominal pain and h/o diverticulitis so this was discontinued.  Started Orencia in October 2019. Hydroxychloroquine discontinued in September 2020    HPI: Shelly Cooper is a 79 y.o. 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 She agreed to Orencia at follow-up in September 2019 but had delay in starting till late October 2019. At follow-up in December 2019 reported ongoing symptoms but advised her to take more time with medication. She was last seen in follow-up in person September 2020 and exam was improved so hydroxychloroquine discontinued. She returns today for follow-up.    We had discussed in October 2020 about need to start metabolic bone therapy. She has not agreed to any medication for it but was leaning towards Reclast. Severe GERD so not great candidate for alendronate. She is not interested in Prolia. She is concerned about the cost but agreed for her to order Reclast today. [name] is working for her. She denies any pain, stiffness or swelling. No issues with the injections. No complications with COVID-19 vaccine. She continues to follow with dermatology for there non melanoma skin issues. She wants to know if she can get tetanus shot. As it is not a live vaccine, no issues to receive tetanus shot.     ROS??:Attests to the above otherwise review of all other system is negative.??  ??  Past Medical and Surgical History:  ??  Patient Active Problem List    Diagnosis Date Noted   ??? Chest pain 09/15/2016   ??? Seronegative rheumatoid arthritis (CMS-HCC) 03/14/2015   ??? De Quervain's tenosynovitis, bilateral 07/17/2014   ??? Nonspecific immunological findings 03/14/2014   ??? Edema  01/03/2014   ??? Primary localized osteoarthrosis, lower leg 07/01/2012   ??? Derangement of medial meniscus 06/23/2012   ??? Other specified cardiac dysrhythmias 03/17/2012   ??? Coronary atherosclerosis (RAF-HCC) 01/29/2012   ??? Essential hypertension 01/29/2012   ??? 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 HYSTERECTOMY  1978    fibroids   ??? NASAL POLYP EXCISION     ??? SKIN BIOPSY     ??? STENTS [facility] HISTORICAL RESULT)  2001    post-MI   ??? TRIGGER FINGER RELEASE       Allergies:   ??  Allergies   Allergen Reactions   ??? Aspirin Swelling     Other reaction(s): Swelling  Facial swelling  Other reaction(s): UNKNOWN   ??? Lyrica  [Pregabalin] Swelling     Other reaction(s): Swelling  Other reaction(s): SWELLING     Current Outpatient Medications:  ??  Current Outpatient Medications:   ???  abatacept (ORENCIA CLICKJECT) 125 mg/mL AtIn, Inject the contents of 1 pen (125 mg) under the skin once a week., Disp: 12 mL, Rfl: 3  ???  clobetasoL (TEMOVATE) 0.05 % ointment, Apply topically Two (2) times a day., Disp: 60 g, Rfl: 1  ???  clopidogrel (PLAVIX) 75 mg tablet, Take 75 mg by mouth daily., Disp: , Rfl:   ???  famotidine (PEPCID) 20 MG tablet, Take 1 tablet twice daily for 2 weeks, then reduce to 1 tablet at bedtime for 2 weeks, then stop. If symptoms return, restart med a bedtime., Disp: , Rfl:   ???  lansoprazole (PREVACID) 30 MG capsule, Take 1 capsule by mouth once daily., Disp: , Rfl:   ???  losartan (COZAAR) 100 MG tablet, Take 100 mg by mouth., Disp: , Rfl:   ???  torsemide (DEMADEX) 20 MG tablet, Take 20 mg by mouth daily as needed. , Disp: , Rfl:       Immunization History   Administered Date(s) Administered   ??? COVID-19 VACCINE,MRNA(MODERNA)(PF)(IM) 09/21/2019, 10/17/2019   ??? INFLUENZA TIV (TRI) 71MO+ W/ PRESERV (IM) 06/14/2014   ??? INFLUENZA TIV (TRI) PF (IM) 07/20/2001, 09/29/2003, 06/15/2007, 06/05/2008, 06/10/2010, 06/30/2011   ??? Influenza Vaccine Quad (IIV4 PF) 50mo+ injectable 05/17/2015, 06/10/2017, 05/19/2018, 05/17/2019   ??? Influenza Virus Vaccine, unspecified formulation 06/24/2014, 06/02/2016   ??? Influenza, High Dose (IIV4) 65 yrs & older 05/19/2018, 05/17/2019   ??? Novel Influenza-H1N1-09 Preservative Free 10/28/2008   ??? PNEUMOCOCCAL POLYSACCHARIDE 23 10/06/2016   ??? Pneumococcal Conjugate 13-Valent 04/03/2014     ????  PHYSICAL EXAM??  Vital signs: BP 139/75 (BP Site: L Arm, BP Position: Sitting, BP Cuff Size: Medium)  - Pulse 66  - Temp 36.2 ??C (97.2 ??F) (Temporal)  - Ht 162.6 cm (5' 4)  - Wt 82.4 kg (181 lb 9.6 oz)  - BMI 31.17 kg/m?? Body mass index is 31.17 kg/m??.  Gen: Well-developed, well-nourished adult in no apparent distress.  Pleasant and cooperative. AOx4.?  HEENT: PERRLA, EOMI, face mask in place  Lungs: Broad chest excursion with good air movement. ????  CV: RRR, Normal S1/S2, No murmurs/rubs/gallops heard.   PV: Warm, 2+ radial pulses, no C/C/E.  Neuro: Good comprehension/cognition.  Gait intact.  Comprehensive Musculoskeletal Examination:????  ?? Jaw, neck without limited ROM.????   ?? Shoulders, elbows, wrists, hands, fingers: Squaring at [facility] bilaterally.  No MCP, PIP or DIP swelling noted.  Wrists, elbows, and shoulders wnl  ?? Feet/toes wnl. Ankles and knees  wnl. Crepitus in knees  Skin: Scattered and extensive erythematous scaly lesions on bilateral lower extremities, likely related to the squamous cell.     Results: Labs completed yesterday at [facility] reviewed in Care Everywhere and no signs for medication toxicity. Creatinine 1.0.     ??GENERAL SUMMARY/IMPRESSION AND RECOMMENDATIONS: ??  ???In summary, the patient is a 79 y.o. female with seronegative RA and non-melanoma skin cancer s/p pericarditis and pleuritis in 2018 now on abatacept weekly injection. Rheumatoid arthritis appears in remission on Abatacept weekly injections by clinical history and exam. Today, we spent discussion on her bone density noting osteoporosis and need for metabolic bone therapy. We discussed at length risks and benefits of alendronate, Reclast and Prolia. After this discussion, she agrees for me to order Reclast but was clear that she would not pursue if cost was too great for her. In addition, advised her to continue to mask up in indoor public spaces and continue social distancing due to her being high risk on immunosuppressive medication and concerns for delta variant break through happening. Reviewed labs completed yesterday at [facility]. No concerns to add Reclast at this time or suggestion for medication toxicity. Follow-up in 4 months with Shelly Cooper [facility] and 8 months with myself.  Contact information provided for any concerns or questions in the interim.        Diagnosis ICD-10-CM Associated Orders   1. Seronegative rheumatoid arthritis (CMS-HCC)  M06.00    2. Osteoporosis, unspecified osteoporosis type, unspecified pathological fracture presence  M81.0    3. High risk medication use  Z79.899      Patient Instructions   Labs look good. Exam looks good. Continue Orencia weekly injections. Renewal time is soon so be prepared to be contacted for paperwork.    You do have osteoporosis on bone density and recommend we start metabolic bone therapy with Reclast. This is a once a year infusion.  Please see literature attached to learn more. We will complete a benefit investigation to determine cost and address cost issues before you get it.     Please complete any major dental work before getting Reclast and we should wait three months after dental work before completing the infusion.          Patient Education        Osteoporosis: Care Instructions  Overview     Osteoporosis causes bones to become thin and weak. It is much more common in women than in men. Your chances of getting this disease depend on several things. These factors include the thickness of your bones (bone density), as well as health, diet, and physical activity.  This disease may be very advanced before you know you have it. Sometimes the first sign is a broken bone in the hip, spine, or wrist. Or you may have sudden pain in your middle or lower back.  Follow-up care is a key part of your treatment and safety. Be sure to make and go to all appointments, and call your doctor if you are having problems. It's also a good idea to know your test results and keep a list of the medicines you take.  How can you care for yourself at home?  ?? Your doctor may prescribe a bisphosphonate, such as risedronate (Actonel) or alendronate (Fosamax), for osteoporosis. If you are taking one of these medicines by mouth:  ? Take your medicine with a full glass of water when you first get up in the morning.  ? Do not lie down,  eat, drink a beverage, or take any other medicine for at least 30 minutes after taking the drug. This helps prevent stomach problems.  ? Do not take your medicine late in the day if you forgot to take it in the morning. Skip it, and take the usual dose the next morning.  ? If you have side effects, tell your doctor. Your doctor may prescribe another medicine.  ?? Get enough calcium and vitamin D. The recommended daily allowances (RDAs) for adults younger than age 26 are 1,000 mg of calcium and 600 IU of vitamin D each day. Women ages 42 to 53 need 1,200 mg of calcium and 600 IU of vitamin D each day. Men ages 67 to 60 need 1,000 mg of calcium and 600 IU of vitamin D each day. Adults 71 and older need 1,200 mg of calcium and 800 IU of vitamin D each day. It's not clear if people who already have osteoporosis need more calcium and vitamin D than this. Talk to your doctor about what's right for you.  ? Eat foods rich in calcium, like yogurt, cheese, milk, and dark green vegetables. This is a good way to get the calcium you need. You can get vitamin D from eggs, fatty fish, cereal, and milk.  ? Ask your doctor if you need to take a calcium plus vitamin D supplement. You may be able to get enough calcium and vitamin D through your diet. Be careful with supplements. Adults ages 55 to 22 should not get more than 2,500 mg of calcium and 4,000 IU of vitamin D each day, whether it is from supplements and/or food. Adults ages 34 and older should not get more than 2,000 mg of calcium and 4,000 IU of vitamin D each day from supplements and/or food.  ?? Limit alcohol to 2 drinks a day for men and 1 drink a day for women. Too much alcohol can cause health problems.  ?? Do not smoke. Smoking puts you at a much higher risk for osteoporosis. If you need help quitting, talk to your doctor about stop-smoking programs and medicines. These can increase your chances of quitting for good.  ?? Get regular bone-building exercise. Weight-bearing and resistance exercises keep bones healthy by working the muscles and bones against gravity. Start out at an exercise level that feels right for you. Add a little at a time until you can do the following:  ? Do 30 minutes of weight-bearing exercise on most days of the week. Walking, jogging, stair climbing, and dancing are good choices.  ? Do resistance exercises with weights or elastic bands 2 to 3 days a week.  ?? Reduce your risk of falls:  ? Wear supportive shoes with low heels and nonslip soles.  ? Use a cane or walker, if you need it. Use shower chairs and bath benches. Put in handrails on stairways, around your shower or tub area, and near the toilet.  ? Keep stairs, porches, and walkways well lit. Use night-lights.  ? Remove throw rugs and other objects that are in the way.  ? Avoid icy, wet, or slippery surfaces.  ? Keep a cordless phone and a flashlight with new batteries by your bed.  When should you call for help?  Watch closely for changes in your health, and be sure to contact your doctor if you have any problems.  Where can you learn more?  Go to [facility] at [email or web address]  Select Patient Education under [employer]. Enter K100 in  the search box to learn more about Osteoporosis: Care Instructions.  Current as of: August 07, 2019??????????????????????????????Content Version: 12.9  ?? 2006-2021 Healthwise[employer].   Care instructions adapted under license by [facility]. If you have questions about a medical condition or this instruction, always ask your healthcare professional. Healthwise, Incorporated disclaims any warranty or liability for your use of this information.         Patient Education        Deciding About Bisphosphonate Medicine for Osteoporosis  How can you decide about taking bisphosphonate medicine for osteoporosis?  This information is right for you if you are a woman who has been through menopause. If that does not describe you, or if you're not sure, talk with your doctor about this decision.  What is osteoporosis?  Osteoporosis is a disease that affects your bones. It means you have bones that are thin and brittle and have lots of holes inside them like a sponge. This makes them easy to break. It can lead to broken bones (fractures), especially in the hip, spine, and wrist. These fractures may make it hard for you to live on your own.  Bisphosphonates are the most common medicines used to prevent bone loss. They may be taken as a pill or an injection into a vein. Bisphosphonates slow the way bone dissolves and is absorbed by your body. They can increase bone thickness and strength.  What are key points about this decision?  ?? If you are at a higher risk of having a fracture, taking bisphosphonates is more likely to help you prevent a fracture. If your risk of a fracture is lower, it's less likely that these medicines will help you.  ?? Bisphosphonates can cause problems with the jaw or thigh bone. But most women do not have these side effects.  ?? Whether you take medicine or not, healthy habits can help protect your bones. Get enough calcium and vitamin D. Get regular weight-bearing exercise. Cut back on alcohol. And if you smoke, quit.  Who is helped the most by bisphosphonates?  For women who have been through menopause:  ?? If you have osteoporosis (your T-score is -2.5 or less) or you have had a fracture, taking bisphosphonates lowers your risk of having a fracture.  ?? If you haven't had a fracture and you have low bone density (your T-score is between -1.0 and -2.5, sometimes called osteopenia), taking bisphosphonates might lower your risk of having a fracture. This evidence is not as strong.  What are the side effects of bisphosphonates?  These medicines can have side effects, such as heartburn and belly pain.  Certain bone problems have also been reported in women taking bisphosphonates. Out of 1,000 people, about 1 person has a bone side effect during a year of taking bisphosphonates. That means 999 out of 1,000 people do not have a bone side effect.  These bone side effects include problems with the jaw bone (called osteonecrosis). They also might include a certain kind of fracture of the thigh bone (called an atypical fracture), but more research is needed to find out if taking bisphosphonates is a cause of these fractures.  Your decision  Thinking about the facts and your feelings can help you make a decision that is right for you. Be sure you understand the benefits and risks of your options, and think about what else you need to do before you make the decision.  Where can you learn more?  Go to [facility] at [email or web address]  Select Patient Education under [employer]. Enter [phone] in the search box to learn more about Deciding About Bisphosphonate Medicine for Osteoporosis.  Current as of: August 07, 2019??????????????????????????????Content Version: 12.9  ?? 2006-2021 Healthwise[employer].   Care instructions adapted under license by [facility]. If you have questions about a medical condition or this instruction, always ask your healthcare professional. Healthwise, [employer] disclaims any warranty or liability for your use of this information.         Patient Education        zoledronic acid  Pronunciation:  ZOE le DRON ik AS id  Brand:  Reclast, Zometa  What is the most important information I should know about zoledronic acid?  Do not use if you are pregnant. Use effective birth control, and ask your doctor how long to prevent pregnancy after you stop using zoledronic acid.  Zoledronic acid can cause serious kidney problems,  especially if you are dehydrated, if you take diuretic medicine, or if you already have kidney disease. Call your doctor if you urinate less than usual, if you have swelling in your feet or ankles, or if you feel tired or short of breath.  Also call your doctor if you have muscle spasms, numbness or tingling (in hands and feet or around the mouth), new or unusual hip pain, or severe pain in your joints, bones, or muscles.  What is zoledronic acid?  Reclast and Zometa are two different brands of zoledronic acid.  Reclast is used to treat or prevent osteoporosis caused by menopause, or steroid use. This medicine also increases bone mass in men with osteoporosis. Reclast is for use when you have a high risk of bone fracture.  Reclast is also used to treat Paget's disease of bone.  Zometa is used to treat high blood levels of calcium caused by cancer (also called hypercalcemia of malignancy). This medicine also treats multiple myeloma (a type of bone marrow cancer) or bone cancer that has spread from elsewhere in the body.  You should not use Reclast and Zometa at the same time.   Zoledronic acid may also be used for purposes not listed in this medication guide.  What should I discuss with my healthcare provider before receiving zoledronic acid?  You should not be treated with zoledronic acid if you are allergic to it.  You also should not receive Reclast if you have:  ?? low levels of calcium in your blood (hypocalcemia); or  ?? severe kidney disease.  You should not be treated with zoledronic acid if are currently using any other bisphosphonate (such as alendronate, etidronate, ibandronate, pamidronate, risedronate, or tiludronate).  Tell your doctor if you have ever had:  ?? kidney disease;  ?? hypocalcemia;  ?? thyroid or parathyroid surgery;  ?? surgery to remove part of your intestine;  ?? asthma caused by taking aspirin;  ?? any condition that makes it hard for your body to absorb nutrients from food (malabsorption);  ?? a dental problem (you may need a dental exam before you receive zoledronic acid);  ?? if you are dehydrated; or  ?? if you take a diuretic or water pill.  Zoledronic acid can cause serious kidney problems,  especially if you are dehydrated, if you take diuretic medicine, or if you already have kidney disease.  This medicine may cause jaw bone problems (osteonecrosis). The risk is highest in people with cancer, blood cell disorders, pre-existing dental problems, or people treated with steroids, chemotherapy, or radiation. Ask your doctor about  your own risk.  You may need to have a negative pregnancy test before starting this treatment.  Do not use if you are pregnant. This medicine may harm an unborn baby or cause birth defects. Zoledronic acid can remain in your body for weeks or years after your last dose. Use effective birth control to prevent pregnancy while using this medicine. Talk with your doctor about the need to prevent pregnancy after you stop using zoledronic acid.  This medicine may affect fertility (ability to have children) in women. However, it is important to use birth control to prevent pregnancy because zoledronic acid can harm an unborn baby.  You should not breastfeed while using zoledronic acid.  How is zoledronic acid given?  Zoledronic acid is given as an infusion into a vein. A healthcare provider will give you this injection.  Zoledronic acid is sometimes given as a single dose only one time. It may also be given once every 1 or 2 years. How often you receive zoledronic acid will depend on why you are using this medicine. Follow your doctor's instructions.  Drink at least 2 glasses of water within a few hours before your injection to keep from getting dehydrated.   You will need frequent medical tests.  Pay special attention to your dental hygiene while using zoledronic acid. Brush and floss your teeth regularly. If you need to have any dental work (especially surgery), tell the dentist ahead of time that you are using zoledronic acid.  Zoledronic acid is only part of a complete program of treatment that may also include diet changes and taking calcium and vitamin supplements. Follow your doctor's instructions very closely.  Your doctor will determine how long to treat you with this medicine. Zoledronic acid is often given for only 3 to 5 years.  What happens if I miss a dose?  Call your doctor for instructions if you miss an appointment for your zoledronic acid injection.  What happens if I overdose?  Seek emergency medical attention or call the Poison Help line at [phone].  What should I avoid while receiving zoledronic acid?  Avoid smoking, or try to quit. Smoking can reduce your bone mineral density, making fractures more likely.  Avoid drinking large amounts of alcohol. Heavy drinking can also cause bone loss.  What are the possible side effects of zoledronic acid?  Get emergency medical help if you have signs of an allergic reaction: hives; wheezing, chest tightness, trouble breathing; swelling of your face, lips, tongue, or throat.  Call your doctor at once if you have:  ?? new or unusual pain in your thigh or hip;  ?? jaw pain or numbness, red or swollen gums, loose teeth, or slow healing after dental work;  ?? severe joint, bone, or muscle pain;  ?? kidney problems --little or no urination, swelling in your feet or ankles, feeling tired;  ?? low red blood cells (anemia) --pale skin, unusual tiredness, feeling light-headed or short of breath, cold hands and feet; or  ?? low calcium levels --muscle spasms or contractions, numbness or tingly feeling (around your mouth, or in your fingers and toes).  Serious side effects on the kidneys may be more likely in older adults.  Common side effects may include:  ?? trouble breathing;  ?? nausea, vomiting, diarrhea, constipation;  ?? bone pain, muscle or joint pain;  ?? fever or other flu symptoms;  ?? tiredness;  ?? eye pain or swelling;  ?? pain in your arms or legs;  ?? headache;  or  ?? anemia.  This is not a complete list of side effects and others may occur. Call your doctor for medical advice about side effects. You may report side effects to FDA at 1-800-FDA-1088.  What other drugs will affect zoledronic acid?  Zoledronic acid can harm your kidneys, especially if you also use certain medicines for infections, cancer, osteoporosis, organ transplant rejection, bowel disorders, high blood pressure, or pain or arthritis (including Advil, Motrin, and Aleve).  Other drugs may affect zoledronic acid, including prescription and over-the-counter medicines, vitamins, and herbal products. Tell your doctor about all your current medicines and any medicine you start or stop using.  Where can I get more information?  Your doctor or pharmacist can provide more information about zoledronic acid.  Remember, keep this and all other medicines out of the reach of children, never share your medicines with others, and use this medication only for the indication prescribed.   Every effort has been made to ensure that the information provided by [employer] ('[employer]') is accurate, up-to-date, and complete, but no guarantee is made to that effect. Drug information contained herein may be time sensitive. Mult[employer] information has been compiled for use by healthcare practitioners and consumers in the [place] and therefore Mult[employer] does not warrant that uses outside of the [place] are appropriate, unless specifically indicated otherwise. Multum's drug information does not endorse drugs, diagnose patients or recommend therapy. Mult[employer]'s drug information is an Investment banker, corporate to assist licensed healthcare practitioners in caring for their patients and/or to serve consumers viewing this service as a supplement to, and not a substitute for, the expertise, skill, knowledge and judgment of healthcare practitioners. The absence of a warning for a given drug or drug combination in no way should be construed to indicate that the drug or drug combination is safe, effective or appropriate for any given patient. Multum does not assume any responsibility for any aspect of healthcare administered with the aid of information Multum provides. The information contained herein is not intended to cover all possible uses, directions, precautions, warnings, drug interactions, allergic reactions, or adverse effects. If you have questions about the drugs you are taking, check with your doctor, nurse or pharmacist.  Copyright [id number] [employer] Version: 18.01. Revision date: 10/27/2019.  Care instructions adapted under license by [facility]. If you have questions about a medical condition or this instruction, always ask your healthcare professional. Healthwise, Incorporated disclaims any warranty or liability for your use of this information.         I personally spent 30 minutes face-to-face and non-face-to-face in the care of this patient, which includes all pre, intra, and post visit time on the date of service.      Tenesha Garza C. Scarlette Calico, MD, PhD  Assistant Professor of Medicine  Department of Medicine/Division of Rheumatology  [facility]  5:44 PM

## 2020-03-28 NOTE — Unmapped (Addendum)
Labs look good. Exam looks good. Continue Orencia weekly injections. Renewal time is soon so be prepared to be contacted for paperwork.    You do have osteoporosis on bone density and recommend we start metabolic bone therapy with Reclast. This is a once a year infusion.  Please see literature attached to learn more. We will complete a benefit investigation to determine cost and address cost issues before you get it.     Please complete any major dental work before getting Reclast and we should wait three months after dental work before completing the infusion.          Patient Education        Osteoporosis: Care Instructions  Overview     Osteoporosis causes bones to become thin and weak. It is much more common in women than in men. Your chances of getting this disease depend on several things. These factors include the thickness of your bones (bone density), as well as health, diet, and physical activity.  This disease may be very advanced before you know you have it. Sometimes the first sign is a broken bone in the hip, spine, or wrist. Or you may have sudden pain in your middle or lower back.  Follow-up care is a key part of your treatment and safety. Be sure to make and go to all appointments, and call your doctor if you are having problems. It's also a good idea to know your test results and keep a list of the medicines you take.  How can you care for yourself at home?  ?? Your doctor may prescribe a bisphosphonate, such as risedronate (Actonel) or alendronate (Fosamax), for osteoporosis. If you are taking one of these medicines by mouth:  ? Take your medicine with a full glass of water when you first get up in the morning.  ? Do not lie down, eat, drink a beverage, or take any other medicine for at least 30 minutes after taking the drug. This helps prevent stomach problems.  ? Do not take your medicine late in the day if you forgot to take it in the morning. Skip it, and take the usual dose the next morning. ? If you have side effects, tell your doctor. Your doctor may prescribe another medicine.  ?? Get enough calcium and vitamin D. The recommended daily allowances (RDAs) for adults younger than age 24 are 1,000 mg of calcium and 600 IU of vitamin D each day. Women ages 3 to 34 need 1,200 mg of calcium and 600 IU of vitamin D each day. Men ages 29 to 54 need 1,000 mg of calcium and 600 IU of vitamin D each day. Adults 71 and older need 1,200 mg of calcium and 800 IU of vitamin D each day. It's not clear if people who already have osteoporosis need more calcium and vitamin D than this. Talk to your doctor about what's right for you.  ? Eat foods rich in calcium, like yogurt, cheese, milk, and dark green vegetables. This is a good way to get the calcium you need. You can get vitamin D from eggs, fatty fish, cereal, and milk.  ? Ask your doctor if you need to take a calcium plus vitamin D supplement. You may be able to get enough calcium and vitamin D through your diet. Be careful with supplements. Adults ages 49 to 66 should not get more than 2,500 mg of calcium and 4,000 IU of vitamin D each day, whether it is from supplements and/or  food. Adults ages 24 and older should not get more than 2,000 mg of calcium and 4,000 IU of vitamin D each day from supplements and/or food.  ?? Limit alcohol to 2 drinks a day for men and 1 drink a day for women. Too much alcohol can cause health problems.  ?? Do not smoke. Smoking puts you at a much higher risk for osteoporosis. If you need help quitting, talk to your doctor about stop-smoking programs and medicines. These can increase your chances of quitting for good.  ?? Get regular bone-building exercise. Weight-bearing and resistance exercises keep bones healthy by working the muscles and bones against gravity. Start out at an exercise level that feels right for you. Add a little at a time until you can do the following:  ? Do 30 minutes of weight-bearing exercise on most days of the week. Walking, jogging, stair climbing, and dancing are good choices.  ? Do resistance exercises with weights or elastic bands 2 to 3 days a week.  ?? Reduce your risk of falls:  ? Wear supportive shoes with low heels and nonslip soles.  ? Use a cane or walker, if you need it. Use shower chairs and bath benches. Put in handrails on stairways, around your shower or tub area, and near the toilet.  ? Keep stairs, porches, and walkways well lit. Use night-lights.  ? Remove throw rugs and other objects that are in the way.  ? Avoid icy, wet, or slippery surfaces.  ? Keep a cordless phone and a flashlight with new batteries by your bed.  When should you call for help?  Watch closely for changes in your health, and be sure to contact your doctor if you have any problems.  Where can you learn more?  Go to [facility] at [email or web address]  Select Patient Education under [employer]. Enter K100 in the search box to learn more about Osteoporosis: Care Instructions.  Current as of: August 07, 2019??????????????????????????????Content Version: 12.9  ?? 2006-2021 Healthwise, Incorporated.   Care instructions adapted under license by [facility]. If you have questions about a medical condition or this instruction, always ask your healthcare professional. Healthwise, Incorporated disclaims any warranty or liability for your use of this information.         Patient Education        Deciding About Bisphosphonate Medicine for Osteoporosis  How can you decide about taking bisphosphonate medicine for osteoporosis?  This information is right for you if you are a woman who has been through menopause. If that does not describe you, or if you're not sure, talk with your doctor about this decision.  What is osteoporosis?  Osteoporosis is a disease that affects your bones. It means you have bones that are thin and brittle and have lots of holes inside them like a sponge. This makes them easy to break. It can lead to broken bones (fractures), especially in the hip, spine, and wrist. These fractures may make it hard for you to live on your own.  Bisphosphonates are the most common medicines used to prevent bone loss. They may be taken as a pill or an injection into a vein. Bisphosphonates slow the way bone dissolves and is absorbed by your body. They can increase bone thickness and strength.  What are key points about this decision?  ?? If you are at a higher risk of having a fracture, taking bisphosphonates is more likely to help you prevent a fracture. If your risk of a fracture  is lower, it's less likely that these medicines will help you.  ?? Bisphosphonates can cause problems with the jaw or thigh bone. But most women do not have these side effects.  ?? Whether you take medicine or not, healthy habits can help protect your bones. Get enough calcium and vitamin D. Get regular weight-bearing exercise. Cut back on alcohol. And if you smoke, quit.  Who is helped the most by bisphosphonates?  For women who have been through menopause:  ?? If you have osteoporosis (your T-score is -2.5 or less) or you have had a fracture, taking bisphosphonates lowers your risk of having a fracture.  ?? If you haven't had a fracture and you have low bone density (your T-score is between -1.0 and -2.5, sometimes called osteopenia), taking bisphosphonates might lower your risk of having a fracture. This evidence is not as strong.  What are the side effects of bisphosphonates?  These medicines can have side effects, such as heartburn and belly pain.  Certain bone problems have also been reported in women taking bisphosphonates. Out of 1,000 people, about 1 person has a bone side effect during a year of taking bisphosphonates. That means 999 out of 1,000 people do not have a bone side effect.  These bone side effects include problems with the jaw bone (called osteonecrosis). They also might include a certain kind of fracture of the thigh bone (called an atypical fracture), but more research is needed to find out if taking bisphosphonates is a cause of these fractures.  Your decision  Thinking about the facts and your feelings can help you make a decision that is right for you. Be sure you understand the benefits and risks of your options, and think about what else you need to do before you make the decision.  Where can you learn more?  Go to [facility] at [email or web address]  Select Patient Education under [employer]. Enter [phone] in the search box to learn more about Deciding About Bisphosphonate Medicine for Osteoporosis.  Current as of: August 07, 2019??????????????????????????????Content Version: 12.9  ?? 2006-2021 Healthwise, Incorporated.   Care instructions adapted under license by [facility]. If you have questions about a medical condition or this instruction, always ask your healthcare professional. Healthwise, Incorporated disclaims any warranty or liability for your use of this information.         Patient Education        zoledronic acid  Pronunciation:  ZOE le DRON ik AS id  Brand:  Reclast, Zometa  What is the most important information I should know about zoledronic acid?  Do not use if you are pregnant. Use effective birth control, and ask your doctor how long to prevent pregnancy after you stop using zoledronic acid.  Zoledronic acid can cause serious kidney problems,  especially if you are dehydrated, if you take diuretic medicine, or if you already have kidney disease. Call your doctor if you urinate less than usual, if you have swelling in your feet or ankles, or if you feel tired or short of breath.  Also call your doctor if you have muscle spasms, numbness or tingling (in hands and feet or around the mouth), new or unusual hip pain, or severe pain in your joints, bones, or muscles.  What is zoledronic acid?  Reclast and Zometa are two different brands of zoledronic acid.  Reclast is used to treat or prevent osteoporosis caused by menopause, or steroid use. This medicine also increases bone mass in men with osteoporosis. Reclast  is for use when you have a high risk of bone fracture.  Reclast is also used to treat Paget's disease of bone.  Zometa is used to treat high blood levels of calcium caused by cancer (also called hypercalcemia of malignancy). This medicine also treats multiple myeloma (a type of bone marrow cancer) or bone cancer that has spread from elsewhere in the body.  You should not use Reclast and Zometa at the same time.   Zoledronic acid may also be used for purposes not listed in this medication guide.  What should I discuss with my healthcare provider before receiving zoledronic acid?  You should not be treated with zoledronic acid if you are allergic to it.  You also should not receive Reclast if you have:  ?? low levels of calcium in your blood (hypocalcemia); or  ?? severe kidney disease.  You should not be treated with zoledronic acid if are currently using any other bisphosphonate (such as alendronate, etidronate, ibandronate, pamidronate, risedronate, or tiludronate).  Tell your doctor if you have ever had:  ?? kidney disease;  ?? hypocalcemia;  ?? thyroid or parathyroid surgery;  ?? surgery to remove part of your intestine;  ?? asthma caused by taking aspirin;  ?? any condition that makes it hard for your body to absorb nutrients from food (malabsorption);  ?? a dental problem (you may need a dental exam before you receive zoledronic acid);  ?? if you are dehydrated; or  ?? if you take a diuretic or water pill.  Zoledronic acid can cause serious kidney problems,  especially if you are dehydrated, if you take diuretic medicine, or if you already have kidney disease.  This medicine may cause jaw bone problems (osteonecrosis). The risk is highest in people with cancer, blood cell disorders, pre-existing dental problems, or people treated with steroids, chemotherapy, or radiation. Ask your doctor about your own risk.  You may need to have a negative pregnancy test before starting this treatment.  Do not use if you are pregnant. This medicine may harm an unborn baby or cause birth defects. Zoledronic acid can remain in your body for weeks or years after your last dose. Use effective birth control to prevent pregnancy while using this medicine. Talk with your doctor about the need to prevent pregnancy after you stop using zoledronic acid.  This medicine may affect fertility (ability to have children) in women. However, it is important to use birth control to prevent pregnancy because zoledronic acid can harm an unborn baby.  You should not breastfeed while using zoledronic acid.  How is zoledronic acid given?  Zoledronic acid is given as an infusion into a vein. A healthcare provider will give you this injection.  Zoledronic acid is sometimes given as a single dose only one time. It may also be given once every 1 or 2 years. How often you receive zoledronic acid will depend on why you are using this medicine. Follow your doctor's instructions.  Drink at least 2 glasses of water within a few hours before your injection to keep from getting dehydrated.   You will need frequent medical tests.  Pay special attention to your dental hygiene while using zoledronic acid. Brush and floss your teeth regularly. If you need to have any dental work (especially surgery), tell the dentist ahead of time that you are using zoledronic acid.  Zoledronic acid is only part of a complete program of treatment that may also include diet changes and taking calcium and vitamin supplements. Follow  your doctor's instructions very closely.  Your doctor will determine how long to treat you with this medicine. Zoledronic acid is often given for only 3 to 5 years.  What happens if I miss a dose?  Call your doctor for instructions if you miss an appointment for your zoledronic acid injection.  What happens if I overdose?  Seek emergency medical attention or call the Poison Help line at [phone].  What should I avoid while receiving zoledronic acid?  Avoid smoking, or try to quit. Smoking can reduce your bone mineral density, making fractures more likely.  Avoid drinking large amounts of alcohol. Heavy drinking can also cause bone loss.  What are the possible side effects of zoledronic acid?  Get emergency medical help if you have signs of an allergic reaction: hives; wheezing, chest tightness, trouble breathing; swelling of your face, lips, tongue, or throat.  Call your doctor at once if you have:  ?? new or unusual pain in your thigh or hip;  ?? jaw pain or numbness, red or swollen gums, loose teeth, or slow healing after dental work;  ?? severe joint, bone, or muscle pain;  ?? kidney problems --little or no urination, swelling in your feet or ankles, feeling tired;  ?? low red blood cells (anemia) --pale skin, unusual tiredness, feeling light-headed or short of breath, cold hands and feet; or  ?? low calcium levels --muscle spasms or contractions, numbness or tingly feeling (around your mouth, or in your fingers and toes).  Serious side effects on the kidneys may be more likely in older adults.  Common side effects may include:  ?? trouble breathing;  ?? nausea, vomiting, diarrhea, constipation;  ?? bone pain, muscle or joint pain;  ?? fever or other flu symptoms;  ?? tiredness;  ?? eye pain or swelling;  ?? pain in your arms or legs;  ?? headache; or  ?? anemia.  This is not a complete list of side effects and others may occur. Call your doctor for medical advice about side effects. You may report side effects to FDA at 1-800-FDA-1088.  What other drugs will affect zoledronic acid?  Zoledronic acid can harm your kidneys, especially if you also use certain medicines for infections, cancer, osteoporosis, organ transplant rejection, bowel disorders, high blood pressure, or pain or arthritis (including Advil, Motrin, and Aleve).  Other drugs may affect zoledronic acid, including prescription and over-the-counter medicines, vitamins, and herbal products. Tell your doctor about all your current medicines and any medicine you start or stop using.  Where can I get more information?  Your doctor or pharmacist can provide more information about zoledronic acid.  Remember, keep this and all other medicines out of the reach of children, never share your medicines with others, and use this medication only for the indication prescribed.   Every effort has been made to ensure that the information provided by [employer]') is accurate, up-to-date, and complete, but no guarantee is made to that effect. Drug information contained herein may be time sensitive. [employer] information has been compiled for use by healthcare practitioners and consumers in the [place] and therefore [employer] does not warrant that uses outside of the [place] are appropriate, unless specifically indicated otherwise. Mult[employer]'s drug information does not endorse drugs, diagnose patients or recommend therapy. [employer]'s drug information is an Armed forces technical officer designed to assist licensed healthcare practitioners in caring for their patients and/or to serve consumers viewing this service as a supplement to, and not a substitute for,  the expertise, skill, knowledge and judgment of healthcare practitioners. The absence of a warning for a given drug or drug combination in no way should be construed to indicate that the drug or drug combination is safe, effective or appropriate for any given patient. Mult[employer] does not assume any responsibility for any aspect of healthcare administered with the aid of information Mult[employer] provides. The information contained herein is not intended to cover all possible uses, directions, precautions, warnings, drug interactions, allergic reactions, or adverse effects. If you have questions about the drugs you are taking, check with your doctor, nurse or pharmacist.  Copyright [id number] [employer] Version: 18.01. Revision date: 10/27/2019.  Care instructions adapted under license by [facility]. If you have questions about a medical condition or this instruction, always ask your healthcare professional. Healthwise, Incorporated disclaims any warranty or liability for your use of this information.

## 2020-04-10 NOTE — Unmapped (Signed)
[facility] RHEUMATOLOGY CLINIC - PHARMACIST NOTES    Reclast is approved but patient is concerned about the cost of infusion.  Reached out to follow up with patient. She states she did not hear from [place], Artist, yet to find out estimated cost of infusion.  Will reach back out to [place] and provided her phone number to patient.     Chelsea Aus

## 2020-05-02 NOTE — Unmapped (Addendum)
[facility] RHEUMATOLOGY CLINIC - PHARMACIST NOTES    Reached out to follow up on status of Reclast cost discussion with Deniece Portela and if patient is ready for infusion. Patient not available, LVM requesting call back.  Requesting update from [place].    Addendum: Patient called back. She got a Engineer, technical sales from [place] and attempted to call back x 2. She will wait for Morrie Sheldon to reach back out to her.     Chelsea Aus

## 2020-05-09 ENCOUNTER — Encounter (INDEPENDENT_AMBULATORY_CARE_PROVIDER_SITE_OTHER): Payer: Medicare Other | Admitting: Ophthalmology

## 2020-05-09 ENCOUNTER — Other Ambulatory Visit: Payer: Self-pay

## 2020-05-09 DIAGNOSIS — M069 Rheumatoid arthritis, unspecified: Secondary | ICD-10-CM | POA: Diagnosis not present

## 2020-05-09 DIAGNOSIS — H353132 Nonexudative age-related macular degeneration, bilateral, intermediate dry stage: Secondary | ICD-10-CM | POA: Diagnosis not present

## 2020-05-09 DIAGNOSIS — H43813 Vitreous degeneration, bilateral: Secondary | ICD-10-CM

## 2020-05-09 DIAGNOSIS — H35033 Hypertensive retinopathy, bilateral: Secondary | ICD-10-CM

## 2020-05-09 DIAGNOSIS — I10 Essential (primary) hypertension: Secondary | ICD-10-CM

## 2020-05-17 ENCOUNTER — Encounter: Admit: 2020-05-17 | Discharge: 2020-05-18 | Payer: MEDICARE

## 2020-05-17 DIAGNOSIS — L570 Actinic keratosis: Principal | ICD-10-CM

## 2020-05-17 DIAGNOSIS — L910 Hypertrophic scar: Principal | ICD-10-CM

## 2020-05-17 DIAGNOSIS — D492 Neoplasm of unspecified behavior of bone, soft tissue, and skin: Principal | ICD-10-CM

## 2020-05-17 MED ORDER — CLOBETASOL 0.05 % TOPICAL OINTMENT
Freq: Two times a day (BID) | TOPICAL | 1 refills | 0.00000 days | Status: CP
Start: 2020-05-17 — End: 2021-05-17

## 2020-05-17 NOTE — Unmapped (Signed)
Cryosurgery  Cryosurgery (???freezing???) uses liquid nitrogen to destroy certain types of skin lesions. Lowering the temperature of the lesion in a small area surrounding skin destroys the lesion. Immediately following cryosurgery, you will notice redness and swelling of the treatment area. Blistering or weeping may occur, lasting approximately one week which will then be followed by crusting. Most areas will heal completely in 10 to 14 days.    Wash the treated areas daily. Allow soap and water to run over the areas, but do not scrub. Should a scab or crust form, allow it to fall off on its own. Do not remove or pick at it. Application of an ointment  and a bandage may make you feel more comfortable, but it is not necessary. Some people develop an allergy to Neosporin, so we recommend that Vaseline or  Aquaphor be used.    The cryotherapy site will be more sensitive than your surrounding skin. Keep it covered, and remember to apply sunscreen every day to all your sun exposed skin. A scar may remain which is lighter or pinker than your normal skin. Your body will continue to improve your scar for up to one year; however a light-colored scar may remain.    Infection following cryotherapy is rare. However if you are worried about the appearance of the treated area, contact your doctor. We have a physician on call at all times. If you have any concerns about the site, please call our clinic at [phone]     Shave biopsy   A shave biopsy involves numbing a small area of your skin and then obtaining a sample to help [place] with proper diagnosis or skin condition. Biopsy results typically return in 7 to 14 days.    To care for the area: Leave the bandage in place until the morning after your procedure is performed. On a daily basis, carefully remove the bandage, then shower or wash as usual. Allow water to run over the site. Please do not scrub. Carefully dry the area, then apply ointment (some people develop an allergy to Neosporin, so we recommend Vaseline orAquaphor). Cover the site with a fresh bandage. Should any bleeding occur, apply firm pressure for 15 minutes. The treated site will heal best if  a scab never forms (the wound heals by new skin cells traveling from the outside toward the middle-their journey is easier if no scab stands in their way).    Long-term care: the site will be more sensitive than your surrounding skin. Keep it covered, and remember to apply sunscreen every day to all your exposed skin. A scar may remain which is lighter or pinker than your normal skin. Your body will continue to improve your scar for up to one year.    Infection following this procedure is rare. However, if you are worried about the appearance of your site, contact your doctor. Complete healing may take up to one month. We have a physician on call at all times. If you have any concerns about the site, please call our clinic at [phone]

## 2020-05-17 NOTE — Unmapped (Unsigned)
Dermatology Note     Assessment and Plan:      Skin Check/Evaluation of Benign Lesions/ findings:    {derm benign skin lesions-complete:70589}  - Reassurance provided regarding the benign appearance of lesions noted on exam today; no treatment is indicated in the absence of symptoms/changes.  - Patient education provided.  - Encouraged regular self-skin monitoring as well as routine clinical examinations for surveillance.  - Reinforced importance of photoprotective strategies including liberal and frequent sunscreen use of a broad spectrum SPF 30 or greater, use of protective clothing, and sun avoidance for prevention of cutaneous malignancy and photoaging.    Actinic Keratoses: After R/B/A discussed (including scarring, pigment alteration, recurrence, or persistence of the lesion) and verbal consent was obtained, identified  lesions were treated with cryotherapy x 1 ten second freeze-thaw cycle. The patient tolerated the procedure well and was instructed on post-cryotherapy wound care.   Total AK's frozen:***  Location and number: ***    Seborrheic Keratoses-inflamed: After R/B/A discussed (including scarring, pigment alteration, recurrence, or persistence of the lesion) and verbal consent was obtained, a identified irritated lesions were treated with cryotherapy x 1 ten second freeze-thaw cycle. The patient tolerated the procedure well and was instructed on post-cryotherapy wound care.   Total SK's frozen:***  Location: ***    Neoplasm/ia- unspecified behavior: To confirm diagnosis, biopsy (s) obtained today.  {neoplasia:75118}   - Prior to the procedure, risks, benefits, and alternatives were discussed (including scarring, pigment alteration, recurrence, or persistence of the lesion) and verbal informed consent was obtained.  - Biopsy (s) of lesion(s) in question was performed using the shave technique with a dermablade following alcohol prep and anesthesia with 2% lidocaine with epinephrine. Hemostasis was obtained in the usual fashion with Aluminum chloride, pressure, and/or electrocautery. Area was dressed with petrolatum and bandage.  Wound care instructions given.   - We will contact the patient with results when available.     Personal history of {Skin cancer list:71371} ***  No evidence of recurrence at this time.      - Counseled patient on the importance of regular self-monitoring as well as clinical skin examinations as scheduled.     Hypertrophic scar  ***  - clobetasoL (TEMOVATE) 0.05 % ointment; Apply topically Two (2) times a day.      The patient was advised to call for an appointment should any new, changing ,or symptomatic lesion develop.     RTC: No follow-ups on file. or sooner as needed   _____________________________________________________________________________________    Referring Provider: Alonna Minium     Chief Complaint     Chief Complaint   Patient presents with   ??? Actinic Keratosis     bisopy spot on right leg.  noticed new spot on left leg by knee. spot on pinky finger left hand no change since cryo treatment. top of right foot spot wantuing to get cryo.        HPI     Shelly Cooper is a pleasant 79 y.o. female, who presents as a returning patient (last seen 02/23/2020) to [facility] for {derm skin check detailed:71584}.  {dermhpigen:75011}  [id number]  ***      The patient denies any other new or changing lesions or areas of concern.     Pertinent Past Medical History     {derm pmh skin check:71266}    Problem List     None           ***    Family  History:   {POSITIVE NEGATIVE:25405} for melanoma.    Past Medical History, Family History, Social History , Med List, Allergies, Problem List reviewed in the rooming section of Epic     ROS: Other than symptoms mentioned in the HPI, {Derm ros - expanded :71411}    Physical Examination     General: Well-appearing female, in no acute distress, resting comfortably  Neuro: Alert and oriented, answers questions appropriately  {DERM PE extent:71507}  Skin examination was notable for the following:  {Derm exam complete:71348}  All areas not commented on are within normal limits or unremarkable.      The patient was seen and examined by Essie Christine, MD who agrees with the assessment and plan as above.

## 2020-05-25 NOTE — Unmapped (Signed)
Will discuss with patient re: treatment options for all lesions, but consider Efudex vs ED&C vs Mohs.    A:  Right proximal shin, shave  - Squamous cell carcinoma, moderately-well differentiated, keratinizing, in situ and focally invasive  Size: 0.2 cm  Histologic grade: 1/4  Anatomic level: II  Thickness: 1.2 mm  Perineural invasion:  None  Margins: Present in edge and base of biopsy  ??  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

## 2020-05-28 NOTE — Unmapped (Signed)
Spoke to patient about biopsy results below.  After discussing treatment options, efficacy rates and risks/benefits of each she has elected for the following:  A: IL 5FU  B: clinical monitoring  C: Efudex    A:  Right proximal shin, shave  - Squamous cell carcinoma, moderately-well differentiated, keratinizing, in situ and focally invasive  Size: 0.2 cm  Histologic grade: 1/4  Anatomic level: II  Thickness: 1.2 mm  Perineural invasion:  None  Margins: Present in edge and base of biopsy  ??  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    Tracking needed for A and C.    Thanks!

## 2020-07-10 ENCOUNTER — Encounter: Admit: 2020-07-10 | Discharge: 2020-07-10 | Discharge status: Against Medical Advice | Payer: MEDICARE

## 2020-07-10 ENCOUNTER — Ambulatory Visit: Admit: 2020-07-10 | Discharge: 2020-07-10 | Discharge status: Against Medical Advice | Payer: MEDICARE

## 2020-07-10 DIAGNOSIS — I10 Essential (primary) hypertension: Principal | ICD-10-CM

## 2020-07-10 DIAGNOSIS — Z951 Presence of aortocoronary bypass graft: Principal | ICD-10-CM

## 2020-07-10 DIAGNOSIS — Z87891 Personal history of nicotine dependence: Principal | ICD-10-CM

## 2020-07-10 DIAGNOSIS — C4492 Squamous cell carcinoma of skin, unspecified: Principal | ICD-10-CM

## 2020-07-10 DIAGNOSIS — R9431 Abnormal electrocardiogram [ECG] [EKG]: Principal | ICD-10-CM

## 2020-07-10 DIAGNOSIS — Z8679 Personal history of other diseases of the circulatory system: Principal | ICD-10-CM

## 2020-07-10 DIAGNOSIS — I161 Hypertensive emergency: Principal | ICD-10-CM

## 2020-07-10 DIAGNOSIS — Z5321 Procedure and treatment not carried out due to patient leaving prior to being seen by health care provider: Principal | ICD-10-CM

## 2020-07-10 DIAGNOSIS — J90 Pleural effusion, not elsewhere classified: Principal | ICD-10-CM

## 2020-07-10 DIAGNOSIS — R778 Other specified abnormalities of plasma proteins: Principal | ICD-10-CM

## 2020-07-10 DIAGNOSIS — Z8249 Family history of ischemic heart disease and other diseases of the circulatory system: Principal | ICD-10-CM

## 2020-07-10 DIAGNOSIS — K219 Gastro-esophageal reflux disease without esophagitis: Principal | ICD-10-CM

## 2020-07-10 LAB — CBC W/ AUTO DIFF
BASOPHILS ABSOLUTE COUNT: 0 10*9/L (ref 0.0–0.1)
BASOPHILS RELATIVE PERCENT: 0.5 %
EOSINOPHILS ABSOLUTE COUNT: 0.3 10*9/L (ref 0.0–0.4)
EOSINOPHILS RELATIVE PERCENT: 4 %
HEMATOCRIT: 46.6 % — ABNORMAL HIGH (ref 36.0–46.0)
HEMOGLOBIN: 14.7 g/dL (ref 12.0–16.0)
LARGE UNSTAINED CELLS: 2 % (ref 0–4)
LYMPHOCYTES ABSOLUTE COUNT: 2.7 10*9/L (ref 1.5–5.0)
LYMPHOCYTES RELATIVE PERCENT: 34.2 %
MEAN CORPUSCULAR HEMOGLOBIN CONC: 31.6 g/dL (ref 31.0–37.0)
MEAN CORPUSCULAR HEMOGLOBIN: 30.2 pg (ref 26.0–34.0)
MEAN CORPUSCULAR VOLUME: 95.7 fL (ref 80.0–100.0)
MEAN PLATELET VOLUME: 8.1 fL (ref 7.0–10.0)
MONOCYTES ABSOLUTE COUNT: 0.4 10*9/L (ref 0.2–0.8)
MONOCYTES RELATIVE PERCENT: 5.4 %
NEUTROPHILS ABSOLUTE COUNT: 4.3 10*9/L (ref 2.0–7.5)
NEUTROPHILS RELATIVE PERCENT: 54.2 %
PLATELET COUNT: 311 10*9/L (ref 150–440)
RED BLOOD CELL COUNT: 4.87 10*12/L (ref 4.00–5.20)
RED CELL DISTRIBUTION WIDTH: 13.3 % (ref 12.0–15.0)
WBC ADJUSTED: 8 10*9/L (ref 4.5–11.0)

## 2020-07-10 LAB — COMPREHENSIVE METABOLIC PANEL
ALBUMIN: 4.4 g/dL (ref 3.4–5.0)
ALKALINE PHOSPHATASE: 138 U/L — ABNORMAL HIGH (ref 46–116)
ALT (SGPT): 11 U/L (ref 10–49)
ANION GAP: 8 mmol/L (ref 5–14)
AST (SGOT): 22 U/L (ref <=34)
BILIRUBIN TOTAL: 0.4 mg/dL (ref 0.3–1.2)
BLOOD UREA NITROGEN: 21 mg/dL (ref 9–23)
BUN / CREAT RATIO: 22
CALCIUM: 9.6 mg/dL (ref 8.7–10.4)
CHLORIDE: 106 mmol/L (ref 98–107)
CO2: 26 mmol/L (ref 20.0–31.0)
CREATININE: 0.95 mg/dL
EGFR CKD-EPI AA FEMALE: 66 mL/min/{1.73_m2} (ref >=60)
EGFR CKD-EPI NON-AA FEMALE: 57 mL/min/{1.73_m2} — ABNORMAL LOW (ref >=60)
GLUCOSE RANDOM: 103 mg/dL (ref 70–179)
POTASSIUM: 3.7 mmol/L (ref 3.4–4.5)
PROTEIN TOTAL: 7.4 g/dL (ref 5.7–8.2)
SODIUM: 140 mmol/L (ref 135–145)

## 2020-07-10 LAB — PROTIME-INR
INR: 0.96
PROTIME: 11.2 s (ref 10.3–13.4)

## 2020-07-10 LAB — HIGH SENSITIVITY TROPONIN I - SINGLE
HIGH SENSITIVITY TROPONIN I: 21 ng/L (ref <=34)
HIGH SENSITIVITY TROPONIN I: 42 ng/L (ref <=34)

## 2020-07-11 DIAGNOSIS — Z8249 Family history of ischemic heart disease and other diseases of the circulatory system: Principal | ICD-10-CM

## 2020-07-11 DIAGNOSIS — R9431 Abnormal electrocardiogram [ECG] [EKG]: Principal | ICD-10-CM

## 2020-07-11 DIAGNOSIS — I10 Essential (primary) hypertension: Principal | ICD-10-CM

## 2020-07-11 DIAGNOSIS — K219 Gastro-esophageal reflux disease without esophagitis: Principal | ICD-10-CM

## 2020-07-11 DIAGNOSIS — Z5321 Procedure and treatment not carried out due to patient leaving prior to being seen by health care provider: Principal | ICD-10-CM

## 2020-07-11 DIAGNOSIS — Z951 Presence of aortocoronary bypass graft: Principal | ICD-10-CM

## 2020-07-11 DIAGNOSIS — I161 Hypertensive emergency: Principal | ICD-10-CM

## 2020-07-11 DIAGNOSIS — J90 Pleural effusion, not elsewhere classified: Principal | ICD-10-CM

## 2020-07-11 DIAGNOSIS — R778 Other specified abnormalities of plasma proteins: Principal | ICD-10-CM

## 2020-07-11 DIAGNOSIS — Z8679 Personal history of other diseases of the circulatory system: Principal | ICD-10-CM

## 2020-07-11 DIAGNOSIS — Z87891 Personal history of nicotine dependence: Principal | ICD-10-CM

## 2020-07-11 DIAGNOSIS — C4492 Squamous cell carcinoma of skin, unspecified: Principal | ICD-10-CM

## 2020-07-11 LAB — HIGH SENSITIVITY TROPONIN I - SINGLE
HIGH SENSITIVITY TROPONIN I: 42 ng/L (ref <=34)
HIGH SENSITIVITY TROPONIN I: 28 ng/L (ref <=34)

## 2020-07-11 MED ORDER — EZETIMIBE 10 MG TABLET
ORAL_TABLET | Freq: Every day | ORAL | 0 refills | 30 days | Status: CP
Start: 2020-07-11 — End: 2020-08-10

## 2020-07-11 MED ADMIN — potassium chloride (KLOR-CON) CR tablet 40 mEq: 40 meq | ORAL | @ 09:00:00 | Stop: 2020-07-11

## 2020-07-11 MED ADMIN — enoxaparin (LOVENOX) syringe 40 mg: 40 mg | SUBCUTANEOUS | @ 13:00:00 | Stop: 2020-07-11

## 2020-07-11 MED ADMIN — clopidogreL (PLAVIX) tablet 75 mg: 75.0 mg | ORAL | @ 13:00:00 | Stop: 2020-07-11

## 2020-07-11 MED ADMIN — amLODIPine (NORVASC) tablet 5 mg: 5 mg | ORAL | @ 08:00:00 | Stop: 2020-07-11

## 2020-07-11 MED ADMIN — losartan (COZAAR) tablet 100 mg: 100 mg | ORAL | @ 13:00:00 | Stop: 2020-07-11

## 2020-07-11 MED ADMIN — labetaloL (NORMODYNE,TRANDATE) injection 10 mg: 10 mg | INTRAVENOUS | @ 07:00:00 | Stop: 2020-07-11

## 2020-07-11 MED ADMIN — pantoprazole (PROTONIX) EC tablet 20 mg: 20 mg | ORAL | @ 13:00:00 | Stop: 2020-07-11

## 2020-07-11 NOTE — Unmapped (Addendum)
1) Hospital follow appointment has been scheduled with your Cardiologist Dr. Harold Hedge,  On 07/18/20 @2 :45pm.  PHONE: [phone]    2) Hospital follow up appointment has been scheduled with your       PCP Dr. Maudie Flakes, on 07/19/20 @ 1:30pm     At [facility] : [phone].

## 2020-07-11 NOTE — Unmapped (Addendum)
Outpatient   [ ]  Re-screen for hypertension, optimize antihypertensive medication regimen with losartan and now amlodipine  [ ]  Outpatient discussion regarding need for ischemic cardiac workup (stress test, etc)    Hospital Course:  Shelly Cooper is a 79 y.o. female with PMHx of RA, HTN, NSTEMI s/p PCI (1999, 2001), that presented to [facility] with hypertension and was found to have elevated troponin.    Acute myocardial injury secondary to HTN:   Presenting with progressively elevated blood pressures over the course the day after incidentally checking her pressures at home and finding her systolic to be in the 160s  which she perseverated on during serial rechecks.  BP as high as 220/100 here, downtrending to 160s/80s after labetalol 10mg  x1.  No recent missed doses of losartan.  Troponin stable at 21, 42, 42, 28; with no ischemic changes on EKG.  Creatinine is stable.  No chest pain or vision changes.  Her presentation is more consistent with demand in the setting of CAD rather than true hypertensive emergency.  Her most recent clinic blood pressure was 134/84.  Last month, had a TTE at [facility] (05/2020) with no significant abnormality of systolic or diastolic function.  Initiating amlodipine as second antihypertensive medication.  Limited echo prior to discharge reveals normal LV function; EF >55%.   -Start amlodipine 5 mg daily  -Continue home losartan 100 mg daily     H/o NSTEMI s/p PCI 1999, 2001:  Recent TTE and troponin elevation discussed per above.  She is without recent angina.  Continue home Plavix.     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 p.r.n. short of breath, wheezing, or cough 1 Inhaler 0    calcium carbonate-vitamin D3 600mg  (1,000mg ) -1,000 unit Tab Take by mouth once daily cholecalciferol (VITAMIN D3) 2,000 unit capsule Take 3 capsules daily for 3 months, then reduce to 1 capsule daily thereafter for Vitamin D Deficiency. (Patient taking differently: 800 Units once daily ) 360 capsule 11    clopidogreL (PLAVIX) 75 mg tablet Take 1 tablet (75 mg total) by mouth every morning 90 tablet 3    famotidine (PEPCID) 20 MG tablet Take 1 tablet twice daily for 2 weeks, then reduce to 1 tablet at bedtime for 2 weeks, then stop. If symptoms return, restart med a bedtime. 60 tablet 11    fluticasone propionate (FLONASE) 50 mcg/actuation nasal spray Place 1 spray into both nostrils 2 (two) times daily (Patient taking differently: Place 1 spray into both nostrils 2 (two) times daily as needed ) 16 g 0    ipratropium (ATROVENT) 0.03 % nasal spray Place 2 sprays into both nostrils 2 (two) times daily 30 mL 11    lansoprazole (PREVACID) 30 MG DR capsule Take 1 capsule (30 mg total) by mouth once daily 90 capsule 3    losartan (COZAAR) 100 MG tablet TAKE 1 TABLET BY MOUTH DAILY 90 tablet 3    sodium chloride (OCEAN) 0.65 % nasal spray Place 1 spray into both nostrils as needed for Congestion 30 mL 11    TORsemide (DEMADEX) 20 MG tablet Take 1.5 tablets (30 mg total) by mouth once daily 90 tablet 2     MEDICATION CHANGES: (sent to [facility] in [place])   -Start amlodipine 5  mg daily      You were admitted to [facility] with elevated blood pressure and elevated troponin levels in your blood which can sometimes occur with damage to your heart. You were monitored overnight and an echocardiogram was done this morning. Your labs showed improvement this morning and your ECG was reassuring. We would like you to follow-up with your cardiologist as next scheduled to review your labs and ECHO, and discuss having a stress test done. We have also started you on an additional medication (amlodipine) to help control your blood pressure. Please try to check your blood pressure only once daily and keep a journal of your BP readings to bring to your next follow-up visit.    You are now ready to discharge and will be discharging to: Home    Please take your medications as prescribed. Medication changes are listed below and a full list of medications is in this discharge packet. Please keep your follow-up appointments after the hospital for ongoing care. It has been a pleasure taking care of you, we wish you the best.     MEDICATION CHANGES:  -- Start amlodipine 5 mg daily in addition to your home losartan.    FOLLOW-UP:  -- It is important to see your primary care provider (PCP) after being in the hospital. Your PCP is [facility] and the phone number of their office is [phone].   -- Follow appointment has been scheduled with your Cardiologist Dr. Harold Hedge, On 07/18/20 @ 2:45pm  -- Follow up appointment has been scheduled with your PCP Dr. Maudie Flakes, on 07/19/20 @ 1:30pm

## 2020-07-11 NOTE — Unmapped (Signed)
Cardiology Discharge Summary    Identifying Information:  Shelly Cooper  05/03/1941  [id number]     Admit Date: 07/11/2020  Discharge Date: 07/11/2020   Discharge To: Home  Discharge Service: Cardiology [facility])  Discharge Attending Physician: Jacquelyne Balint, MD  Primary Care Physician: Alonna Minium     Discharge Diagnoses:    Principal Diagnosis:  Acute myocardial injury secondary to??HTN    Secondary Diagnoses:  H/o NSTEMI s/p PCI 1999, 2001  GERD  Psoriatic arthritis            Outpatient Provider Follow Up Issues:  [ ]  Re-screen for hypertension, optimize antihypertensive medication regimen with losartan and now amlodipine  [ ]  Outpatient discussion regarding need for ischemic cardiac workup (stress test, etc)    Hospital Course:  Outpatient   [ ]  Re-screen for hypertension, optimize antihypertensive medication regimen with losartan and now amlodipine  [ ]  Outpatient discussion regarding need for ischemic cardiac workup (stress test, etc)    Hospital Course:  Shelly Cooper is a 79 y.o. female with PMHx of RA, HTN, NSTEMI s/p PCI (1999, 2001), that presented to [facility] with hypertension and was found to have elevated troponin.    Acute myocardial injury secondary to HTN:   Presenting with progressively elevated blood pressures over the course the day after incidentally checking her pressures at home and finding her systolic to be in the 160s  which she perseverated on during serial rechecks.  BP as high as 220/100 here, downtrending to 160s/80s after labetalol 10mg  x1.  No recent missed doses of losartan.  Troponin stable at 21, 42, 42, 28; with no ischemic changes on EKG.  Creatinine is stable.  No chest pain or vision changes.  Her presentation is more consistent with demand in the setting of CAD rather than true hypertensive emergency.  Her most recent clinic blood pressure was 134/84.  Last month, had a TTE at [facility] (05/2020) with no significant abnormality of systolic or diastolic function.  Initiating amlodipine as second antihypertensive medication.  Limited echo prior to discharge reveals normal LV function; EF >55%.   -Start amlodipine 5 mg daily  -Continue home losartan 100 mg daily     H/o NSTEMI s/p PCI 1999, 2001:  Recent TTE and troponin elevation discussed per above.  She is without recent angina.  Continue home Plavix.     Chronic medical problems:   GERD: Home PPI  Psoriatic Arthritis: On Orencia at home      PREVIOUS MEDICATIONS:  ??? abatacept (ORENCIA) 125 mg/mL injection syringe Inject 125 mg subcutaneously every 7 (seven) days   ??? albuterol 90 mcg/actuation inhaler 2 puffs q.i.d. p.r.n. short of breath, wheezing, or cough 1 Inhaler 0   ??? calcium carbonate-vitamin D3 600mg  (1,000mg ) -1,000 unit Tab Take by mouth once daily   ??? cholecalciferol (VITAMIN D3) 2,000 unit capsule Take 3 capsules daily for 3 months, then reduce to 1 capsule daily thereafter for Vitamin D Deficiency. (Patient taking differently: 800 Units once daily ) 360 capsule 11   ??? clopidogreL (PLAVIX) 75 mg tablet Take 1 tablet (75 mg total) by mouth every morning 90 tablet 3   ??? famotidine (PEPCID) 20 MG tablet Take 1 tablet twice daily for 2 weeks, then reduce to 1 tablet at bedtime for 2 weeks, then stop. If symptoms return, restart med a bedtime. 60 tablet 11   ??? fluticasone propionate (FLONASE) 50 mcg/actuation nasal spray Place 1 spray into both nostrils 2 (two) times daily (Patient taking differently:  Place 1 spray into both nostrils 2 (two) times daily as needed ) 16 g 0   ??? ipratropium (ATROVENT) 0.03 % nasal spray Place 2 sprays into both nostrils 2 (two) times daily 30 mL 11   ??? lansoprazole (PREVACID) 30 MG DR capsule Take 1 capsule (30 mg total) by mouth once daily 90 capsule 3   ??? losartan (COZAAR) 100 MG tablet TAKE 1 TABLET BY MOUTH DAILY 90 tablet 3   ??? sodium chloride (OCEAN) 0.65 % nasal spray Place 1 spray into both nostrils as needed for Congestion 30 mL 11   ??? TORsemide (DEMADEX) 20 MG tablet Take 1.5 tablets (30 mg total) by mouth once daily 90 tablet 2     MEDICATION CHANGES: (sent to [facility] in [place])   -Start amlodipine 5 mg daily      You were admitted to [facility] with elevated blood pressure and elevated troponin levels in your blood which can sometimes occur with damage to your heart. You were monitored overnight and an echocardiogram was done this morning. Your labs showed improvement this morning and your ECG was reassuring. We would like you to follow-up with your cardiologist as next scheduled to review your labs and ECHO, and discuss having a stress test done. We have also started you on an additional medication (amlodipine) to help control your blood pressure. Please try to check your blood pressure only once daily and keep a journal of your BP readings to bring to your next follow-up visit.    You are now ready to discharge and will be discharging to: Home    Please take your medications as prescribed. Medication changes are listed below and a full list of medications is in this discharge packet. Please keep your follow-up appointments after the hospital for ongoing care. It has been a pleasure taking care of you, we wish you the best.     MEDICATION CHANGES:  -- Start amlodipine 5 mg daily in addition to your home losartan.    FOLLOW-UP:  -- It is important to see your primary care provider (PCP) after being in the hospital. Your PCP is [facility] and the phone number of their office is [phone].   -- Follow appointment has been scheduled with your Cardiologist Dr. Harold Hedge, On 07/18/20 @ 2:45pm  -- Follow up appointment has been scheduled with your PCP Dr. Maudie Flakes, on 07/19/20 @ 1:30pm        Procedures:  No admission procedures for hospital encounter.    Discharge Day Services:  BP 162/71  - Pulse 67  - Temp 36.6 ??C (Oral)  - Resp 17  - SpO2 96%   Pt seen on the day of discharge and determined appropriate for discharge.    Condition at Discharge: good    Discharge Medications:     Your Medication List      START taking these medications    amLODIPine 5 MG tablet  Commonly known as: NORVASC  Take 1 tablet (5 mg total) by mouth daily.  Start taking on: July 12, 2020     ezetimibe 10 mg tablet  Commonly known as: ZETIA  Take 1 tablet (10 mg total) by mouth daily.        CONTINUE taking these medications    clobetasoL 0.05 % ointment  Commonly known as: TEMOVATE  Apply topically Two (2) times a day.     lansoprazole 30 MG capsule  Commonly known as: PREVACID  Take 1 capsule  by mouth once daily.     losartan 100 MG tablet  Commonly known as: COZAAR  Take 100 mg by mouth daily.     ORENCIA CLICKJECT 125 mg/mL Atin  Generic drug: abatacept  Inject the contents of 1 pen (125 mg) under the skin once a week.     PLAVIX 75 mg tablet  Generic drug: clopidogreL  Take 75 mg by mouth daily.     torsemide 20 MG tablet  Commonly known as: DEMADEX  Take 20 mg by mouth daily as needed.            Recent Labs:  Microbiology Results (last day)     ** No results found for the last 24 hours. **        Lab Results   Component Value Date    WBC 8.0 07/10/2020    HGB 14.7 07/10/2020    HCT 46.6 (H) 07/10/2020    PLT 311 07/10/2020     Lab Results   Component Value Date    NA 140 07/10/2020    K 3.7 07/10/2020    CL 106 07/10/2020    CO2 26.0 07/10/2020    BUN 21 07/10/2020    CREATININE 0.95 07/10/2020    CALCIUM 9.6 07/10/2020    MG 2.3 (H) 09/16/2016    PHOS 3.0 09/16/2016     Lab Results   Component Value Date    ALKPHOS 138 (H) 07/10/2020    BILITOT 0.4 07/10/2020    PROT 7.4 07/10/2020    ALBUMIN 4.4 07/10/2020    ALT 11 07/10/2020    AST 22 07/10/2020     Lab Results   Component Value Date    PT 11.2 07/10/2020    INR 0.96 07/10/2020    APTT 28.9 09/14/2016     Radiology:  ECG 12 Lead    Result Date: 07/11/2020  NORMAL SINUS RHYTHM MINIMAL VOLTAGE CRITERIA FOR LVH, MAY BE NORMAL VARIANT NONSPECIFIC T WAVE ABNORMALITY ABNORMAL ECG WHEN COMPARED WITH ECG OF 10-Jul-2020 22:08, NO SIGNIFICANT CHANGE WAS FOUND Confirmed by Yaakov Guthrie ([id number]) on 07/11/2020 2:34:17 PM    XR Chest Portable    Result Date: 07/11/2020  EXAM: XR CHEST PORTABLE DATE: 07/11/2020 1:33 AM ACCESSION: [id number] UN DICTATED: 07/11/2020 1:48 AM INTERPRETATION LOCATION: Main [facility] CLINICAL INDICATION: 79 years old Female with CHEST PAIN  COMPARISON: None TECHNIQUE: Portable upright view of the chest. FINDINGS: Obscuration of the inferior left and right heart borders. No right pleural effusion. Question moderate left pleural effusion. No pneumothorax. Mediastinal silhouette is within normal limits. Partially obscured cardiac silhouette.     Obscuration of the inferior left and right heart borders, could represent pericardial fat, atelectasis, infection. Question moderate left pleural effusion. Consider further characterization with two-view chest radiograph.    Echocardiogram W Colorflow Spectral Doppler    Result Date: 07/11/2020  Patient Info Name:     Shelly Cooper Age:     70 years DOB:     [birth year] Gender:     Female MRN:     [id number] Accession #:     [id number] UN Ht:     163 cm Wt:     82 kg BSA:     1.95 m2 BP:     165 /     74 mmHg Technical Quality:     Fair Exam Date:     07/11/2020 2:02 PM Site Location:     [facility] Exam Location:     [facility] Admit Date:  07/11/2020 Exam Type:     ECHOCARDIOGRAM W COLORFLOW SPECTRAL DOPPLER Study Info Indications      - quality measure; reflex to troponin 42 required before discharge Complete two-dimensional, color flow and Doppler transthoracic echocardiogram is performed. Staff Referring Physician:     SELF, REFERRED  ; Reading Fellow:     Jiles Garter MD Sonographer:     Rupert Stacks Ordering Physician:     Maryan Char Account #:     [id number] Summary   1. The left ventricle is normal in size with upper normal wall thickness.   2. The left ventricular systolic function is normal, LVEF is visually estimated at > 55%.   3. There is grade I diastolic dysfunction (impaired relaxation).   4. The left atrium is mildly dilated in size.   5. The right ventricle is normal in size, with normal systolic function. Left Ventricle   The left ventricle is normal in size with upper normal wall thickness.   The left ventricular systolic function is normal, LVEF is visually estimated at > 55%.   There is grade I diastolic dysfunction (impaired relaxation). Right Ventricle   The right ventricle is normal in size, with normal systolic function. Left Atrium   The left atrium is mildly dilated in size. Right Atrium   The right atrium is normal  in size. Aortic Valve   The aortic valve is trileaflet with normal appearing leaflets with normal excursion.   There is trivial aortic regurgitation.   There is no evidence of a significant transvalvular gradient. Pulmonic Valve   The pulmonic valve is poorly visualized, but probably normal.   There is no significant pulmonic regurgitation.   There is no evidence of a significant transvalvular gradient. Mitral Valve   The mitral valve leaflets are normal with normal leaflet mobility.   There is trivial mitral valve regurgitation. Tricuspid Valve   The tricuspid valve leaflets are normal, with normal leaflet mobility.   There is no significant tricuspid regurgitation.   Pulmonary systolic pressure cannot be estimated due to insufficient TR jet. Pericardium/Pleural   There is a trivial pericardial effusion. Inferior Vena Cava   IVC size and inspiratory change suggest normal right atrial pressure. (0-5 mmHg). Aorta   The aorta is normal in size in the visualized segments. Pulmonic Valve ---------------------------------------------------------------------- Name                                 Value        Normal ---------------------------------------------------------------------- PV Doppler ---------------------------------------------------------------------- PV Peak Velocity                   0.9 m/s Mitral Valve ---------------------------------------------------------------------- Name                                 Value        Normal ---------------------------------------------------------------------- MV Diastolic Function ---------------------------------------------------------------------- MV E Peak Velocity                 55 cm/s               MV A Peak Velocity                 84 cm/s               MV E/A  0.6               MV Annular TDI ---------------------------------------------------------------------- MV Septal e' Velocity             3.3 cm/s         >=8.0 MV Lateral e' Velocity            4.4 cm/s        >=10.0 MV e' Average                          3.9               MV E/e' (Average)                     14.5 Tricuspid Valve ---------------------------------------------------------------------- Name                                 Value        Normal ---------------------------------------------------------------------- Estimated PAP/RSVP ---------------------------------------------------------------------- RA Pressure                         3 mmHg           <=5 Aorta ---------------------------------------------------------------------- Name                                 Value        Normal ---------------------------------------------------------------------- Ascending Aorta ---------------------------------------------------------------------- Ao Root Diameter (2D)               3.3 cm               Ao Root Diam Index (2D)         16.9 cm/m2 Venous ---------------------------------------------------------------------- Name                                 Value        Normal ---------------------------------------------------------------------- IVC/SVC ---------------------------------------------------------------------- IVC Diameter (Exp 2D)               1.8 cm         <=2.1 Aortic Valve ---------------------------------------------------------------------- Name Value        Normal ---------------------------------------------------------------------- AV Doppler ---------------------------------------------------------------------- AV Peak Velocity                   1.4 m/s Ventricles ---------------------------------------------------------------------- Name                                 Value        Normal ---------------------------------------------------------------------- LV Dimensions 2D/MM ---------------------------------------------------------------------- IVS Diastolic Thickness (2D)        1.0 cm       0.6-0.9 LVID Diastole (2D)                  4.4 cm       3.8-5.2 LVPW Diastolic Thickness (2D)                                1.0 cm       0.6-0.9 LVID Systole (2D)  2.8 cm       2.2-3.5 RV Dimensions 2D/MM ---------------------------------------------------------------------- RV Basal Diastolic Dimension        2.3 cm       2.5-4.1 TAPSE                               2.1 cm         >=1.7 Atria ---------------------------------------------------------------------- Name                                 Value        Normal ---------------------------------------------------------------------- LA Dimensions ---------------------------------------------------------------------- LA Dimension (2D)                   4.0 cm       2.7-3.8 LA Volume (BP MOD)                   57 ml               LA Volume Index (BP MOD)       29.12 ml/m2   16.00-34.00 RA Dimensions ---------------------------------------------------------------------- RA Area (4C)                      12.3 cm2        <=18.0 RA Area (4C) Index              6.3 cm2/m2 Report Signatures Resident Jiles Garter  MD on 07/11/2020 04:32 PM      Discharge Instructions:  Activity Instructions     Activity as tolerated            Other Instructions     Call MD for:  difficulty breathing, headache or visual disturbances      Call MD for:  persistent nausea or vomiting Call MD for:  severe uncontrolled pain      Call MD for:  temperature >38.5 Celsius      Discharge instructions      You were admitted to [facility] with elevated blood pressure and elevated troponin levels in your blood which can sometimes occur with damage to your heart. You were monitored overnight and an echocardiogram was done this morning. Your labs showed improvement this morning and your ECG was reassuring. We would like you to follow-up with your cardiologist as next scheduled to review your labs and ECHO, and discuss having a stress test done. We have also started you on an additional medication (amlodipine) to help control your blood pressure. Please try to check your blood pressure only once daily and keep a journal of your BP readings to bring to your next follow-up visit.    You are now ready to discharge and will be discharging to: Home    Please take your medications as prescribed. Medication changes are listed below and a full list of medications is in this discharge packet. Please keep your follow-up appointments after the hospital for ongoing care. It has been a pleasure taking care of you, we wish you the best.     MEDICATION CHANGES:  -- Start amlodipine 5 mg daily in addition to your home losartan.    FOLLOW-UP:  -- It is important to see your primary care provider (PCP) after being in the hospital. Your PCP is [facility] and the phone number of their office is [phone].   --  Follow appointment has been scheduled with your Cardiologist Dr. Harold Hedge, On 07/18/20 @ 2:45pm  -- Follow up appointment has been scheduled with your PCP Dr. Maudie Flakes, on 07/19/20 @ 1:30pm         You were admitted to [facility] with elevated blood pressure and elevated troponin levels in your blood which can sometimes occur with damage to your heart. You were monitored overnight and an echocardiogram was done this morning. Your labs showed improvement this morning and your ECG was reassuring. We would like you to follow-up with your cardiologist as next scheduled to review your labs and ECHO, and discuss having a stress test done. We have also started you on an additional medication (amlodipine) to help control your blood pressure. Please try to check your blood pressure only once daily and keep a journal of your BP readings to bring to your next follow-up visit.    You are now ready to discharge and will be discharging to: Home    Please take your medications as prescribed. Medication changes are listed below and a full list of medications is in this discharge packet. Please keep your follow-up appointments after the hospital for ongoing care. It has been a pleasure taking care of you, we wish you the best.     MEDICATION CHANGES:  -- Start amlodipine 5 mg daily in addition to your home losartan.    FOLLOW-UP:  -- It is important to see your primary care provider (PCP) after being in the hospital. Your PCP is [facility] and the phone number of their office is [phone].   -- Follow appointment has been scheduled with your Cardiologist Dr. Harold Hedge, On 07/18/20 @ 2:45pm  -- Follow up appointment has been scheduled with your PCP Dr. Maudie Flakes, on 07/19/20 @ 1:30pm          Follow Up instructions and Outpatient Referrals     Call MD for:  difficulty breathing, headache or visual disturbances      Call MD for:  persistent nausea or vomiting      Call MD for:  severe uncontrolled pain      Call MD for:  temperature >38.5 Celsius      Discharge instructions      You were admitted to [facility] with elevated blood pressure and elevated troponin levels in your blood which can sometimes occur with damage to your heart. You were monitored overnight and an echocardiogram was done this morning. Your labs showed improvement this morning and your ECG was reassuring. We would like you to follow-up with your cardiologist as next scheduled to review your labs and ECHO, and discuss having a stress test done. We have also started you on an additional medication (amlodipine) to help control your blood pressure. Please try to check your blood pressure only once daily and keep a journal of your BP readings to bring to your next follow-up visit.    You are now ready to discharge and will be discharging to: Home    Please take your medications as prescribed. Medication changes are listed below and a full list of medications is in this discharge packet. Please keep your follow-up appointments after the hospital for ongoing care. It has been a pleasure taking care of you, we wish you the best.     MEDICATION CHANGES:  -- Start amlodipine 5 mg daily in addition to your home losartan.    FOLLOW-UP:  -- It is important to see your  primary care provider (PCP) after being in the hospital. Your PCP is [facility] and the phone number of their office is [phone].   -- Follow appointment has been scheduled with your Cardiologist Dr. Harold Hedge, On 07/18/20 @ 2:45pm  -- Follow up appointment has been scheduled with your PCP Dr. Maudie Flakes, on 07/19/20 @ 1:30pm        Appointments which have been scheduled for you    Aug 01, 2020 10:00 AM  (Arrive by 9:45 AM)  RETURN  RHEUMATOLOGY with Staci Righter, PA  [facility] RHEUMATOLOGY SPECIALTY [place] [facility] REGION) [address]  [place] [address]  [phone]      Aug 16, 2020  4:00 PM  (Arrive by 3:45 PM)  RETURN  COMPLEX with Benjie Karvonen, MD  [facility] DERMATOLOGY AND SKIN CENTER [facility] REGION) [address] [place] [address]  [place] [address]  [phone]      Nov 28, 2020 11:20 AM  (Arrive by 11:05 AM)  RETURN  RHEUMATOLOGY with Ethlyn Gallery, MD  [facility] RHEUMATOLOGY SPECIALTY [place] [facility] REGION) [address]  [place] [address]  [phone]      Additional instructions:    1) Hospital follow appointment has been scheduled with your Cardiologist Dr. Harold Hedge,  On 07/18/20 @2 :45pm.  PHONE: [phone]    2) Hospital follow up appointment has been scheduled with your       PCP Dr. Maudie Flakes, on 07/19/20 @ 1:30pm     At [facility] : [phone].           Length of Discharge: I spent greater than 30 mins in the discharge of this patient.    Dyane Dustman, MS3    I attest that I have reviewed the medical student note and that the components of the history of the present illness, the physical exam, and the assessment and plan documented were performed by me or were performed in my presence by the student where I verified the documentation and performed (or re-performed) the exam and medical decision making.    Marya Amsler, MD  Internal Medicine, PGY-2

## 2020-07-11 NOTE — Unmapped (Signed)
Pt reports bp increasing throughout day. Reports feeling tightness now in chest, slight anxiety as well. Denies HA, lightheaded, dizziness.

## 2020-07-11 NOTE — Unmapped (Signed)
Cardiology - Team 2 [facility]) History & Physical    Assessment & Plan:   Shelly Cooper is a 79 y.o. female with PMHx of RA, HTN, NSTEMI s/p PCI (1999, 2001), that presented to [facility] with hypertension and was found to have elevated troponin.    Active Problems:    * No active hospital problems. *  Resolved Problems:    * No resolved hospital problems. *    Type 2 MI secondary to HTN:   Presenting with progressively elevated blood pressures over the course the day after incidentally checking her pressures at home and finding her systolic to be in the 160s  which she perseverated on during serial rechecks.  BP as high as 220/100 here, downtrending to 160s/80s after labetalol x1.  No recent missed doses of losartan.  Slight elevation in troponin to 42 on repeat, with no ischemic changes on EKG.  Creatinine is stable.  No chest pain or vision changes.  Her presentation seems more consistent with demand in the setting of CAD rather than true hypertensive emergency.  Her most recent clinic blood pressure was 134/84.  Last month, had a TTE at [facility] with no significant abnormality of systolic or diastolic function.  We will plan for repeat EKG and troponin in the a.m. initiating amlodipine in addition to home losartan.  -Start amlodipine 5 mg daily  -Continue home losartan 100 mg daily  -Repeat troponin in the a.m.    H/o NSTEMI s/p PCI 1999, 2001:  Recent TTE and troponin elevation discussed per above.  She is without recent angina.  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 Please update the Problem List and refresh.    Subjective:   HPI:  Shelly Cooper is a 79 y.o. female with PMHx of RA, HTN, NSTEMI s/p PCI (1999, 2001), that presented to [facility] with hypertension.     She notes that her husband was recently discharged after heart catheterization and has been checking his blood pressure daily.  This morning, she decided she would check her blood pressure as well, and was surprised to find that her systolic was 168.  She notes that this is unusual for her, as her blood pressure is generally in the 120-140 range.  Throughout the rest the day, she checked her blood pressure several more times, and the systolic continue to uptrend, up to ~190.  She additionally became concerned, as the cough began pulling off of her arm, and she was worried that this was an indication of significantly elevated blood pressures.  Denies any chest pain, vision changes, headache.  No history of significant hypertension in the past.  Has not missed any doses of her losartan recently.  Her most recent blood pressure check was last month at her cardiologist, and she notes that she remembers her systolic being in the 120s or 161W.    In the ED, she was found to have an elevated systolic blood pressures to 220/100.  Repeat troponin of trended to 42 from 21.  EKG without ischemic changes.    Designated Chiropodist Maker:  Ms. Rosalyn Gess currently has decisional capacity for healthcare decision-making and is able to designate a surrogate healthcare decision maker.  Ms. Takeshita's designated healthcare decision maker(s) is/are husband-per patient stated preference.    Allergies:  Aspirin and Lyrica  [pregabalin]    Medications:   Prior to Admission medications    Medication Dose, Route, Frequency   abatacept (ORENCIA CLICKJECT) 125 mg/mL AtIn Inject the contents of 1 pen (125 mg) under the skin once a week.   clobetasoL (TEMOVATE) 0.05 % ointment Topical, 2 times a day (standard)   clopidogrel (PLAVIX) 75 mg tablet 75 mg, Daily (standard)   famotidine (PEPCID) 20 MG tablet Take 1 tablet twice daily for 2 weeks, then reduce to 1 tablet at bedtime for 2 weeks, then stop. If symptoms return, restart med a bedtime.   lansoprazole (PREVACID) 30 MG capsule 1 capsule, Oral, Daily (RT)   losartan (COZAAR) 100 MG tablet 100 mg, Oral   torsemide (DEMADEX) 20 MG tablet 20 mg, Oral, Daily PRN       Medical History:  Past Medical History:   Diagnosis Date   ??? Basal cell carcinoma    ??? GERD (gastroesophageal reflux disease)    ??? Hypertension    ??? Myocardial infarction (CMS-HCC) 1999, 2001    Stents, 2001   ??? Rheumatoid arthritis(714.0)    ??? Squamous cell skin cancer    ??? Trigger finger        Surgical History:  Past Surgical History:   Procedure Laterality Date   ??? HYSTERECTOMY  1978    fibroids   ??? NASAL POLYP EXCISION     ??? SKIN BIOPSY     ??? STENTS [facility] HISTORICAL RESULT)  2001    post-MI   ??? TRIGGER FINGER RELEASE         Family History:   Family History   Problem Relation Age of Onset   ??? Cancer Mother 31        pancreatic cancer   ??? Hyperlipidemia Father    ??? Heart disease Father    ??? Diabetes Maternal Uncle    ??? Diabetes Maternal Grandmother    ??? Basal cell carcinoma Sister    ??? Squamous cell carcinoma Sister    ??? Melanoma Neg Hx        Social History:  The patient lives with family    Social History     Tobacco Use   ??? Smoking status: Former Smoker     Packs/day: 0.50     Years: 5.00     Pack years: 2.50     Quit date: 09/29/1975     Years since quitting: 44.8   ??? Smokeless tobacco: Never Used   Vaping Use   ??? Vaping Use: Never used   Substance Use Topics   ??? Alcohol use: No     Alcohol/week: 0.0 standard drinks   ??? Drug use: No        Review of Systems:  10 systems were reviewed and are negative unless otherwise mentioned in the HPI    Objective:   Physical Exam:  Temp:  [36.6 ??C] 36.6 ??C  Heart Rate:  [53-77] 53  Resp:  [15-18] 15  BP: (164-220)/(76-100) 164/76  SpO2:  [97 %-100 %] 97 %    GEN: Lying in bed in no acute distress  HEENT: Anicteric sclera, Extraocular movement intact and Pupils equal round and reactive to light and accomodation  CV: Regular rate and No murmurs, rubs or gallops  PULM: Lungs clear bilaterally  ABD: soft, non-tender, non-distended, bowel sounds present  EXT: Trace bilateral lower extremity edema.  NEURO: moving  all 4 extremities spontaneously against gravity, no facial droop  PSYCH: Alert and oriented to person, place and time  GU: No CVA tenderness  MSK: No spinal tenderness    Labs/Studies/Imaging:  Labs, Studies, Imaging from the last 24hrs per EMR and personally reviewed

## 2020-07-11 NOTE — Unmapped (Signed)
Emergency Department Provider Note        ED Clinical Impression     Final diagnoses:   Elevated troponin (Primary)   Hypertensive emergency       ED Assessment/Plan       1:02 AM    Patient is here in the emergency department with a chief complaint of elevated blood pressure.  She presented with a blood pressure in the 220s.  Her most recent blood pressure is 193/78.  Unfortunately, she also has increasingly uptrending troponins.  I suspect this may be a type II elevation secondary to hypertension.  There are some nonspecific T wave changes on her EKG but no ST segment elevation.  Plan today will be to control blood pressure and admit to cardiology.    1:51 AM    Patient has been assigned to the cardiology team.    History     Chief Complaint   Patient presents with   ??? High Blood Pressure     HPI    This is a 79 year old with a history of MI and hypertension who presents to the emergency department complaining of elevated blood pressure.  On several different machines throughout the course of today she noted high blood pressure readings in the systolic range of 180s to 220s.  This is unusual for her.  She was taking this only to check her blood pressure and did not feel bad.  She had no chest pain, shortness of breath, headache, vision changes, abdominal pain, or new leg edema.  She does have chronic lower extremity edema.  Patient took her regular losartan and took an extra one half dose to no avail.    Past Medical History:   Diagnosis Date   ??? Basal cell carcinoma    ??? GERD (gastroesophageal reflux disease)    ??? Hypertension    ??? Myocardial infarction (CMS-HCC) 1999, 2001    Stents, 2001   ??? Rheumatoid arthritis(714.0)    ??? Squamous cell skin cancer    ??? Trigger finger        Past Surgical History:   Procedure Laterality Date   ??? HYSTERECTOMY  1978    fibroids   ??? NASAL POLYP EXCISION     ??? SKIN BIOPSY     ??? STENTS [facility] HISTORICAL RESULT)  2001    post-MI   ??? TRIGGER FINGER RELEASE         Family History Problem Relation Age of Onset   ??? Cancer Mother 38        pancreatic cancer   ??? Hyperlipidemia Father    ??? Heart disease Father    ??? Diabetes Maternal Uncle    ??? Diabetes Maternal Grandmother    ??? Basal cell carcinoma Sister    ??? Squamous cell carcinoma Sister    ??? Melanoma Neg Hx        Social History     Socioeconomic History   ??? Marital status: Married     Spouse name: None   ??? Number of children: None   ??? Years of education: None   ??? Highest education level: None   Occupational History   ??? None   Tobacco Use   ??? Smoking status: Former Smoker     Packs/day: 0.50     Years: 5.00     Pack years: 2.50     Quit date: 09/29/1975     Years since quitting: 44.8   ??? Smokeless tobacco: Never Used   Vaping Use   ???  Vaping Use: Never used   Substance and Sexual Activity   ??? Alcohol use: No     Alcohol/week: 0.0 standard drinks   ??? Drug use: No   ??? Sexual activity: Not Currently   Other Topics Concern   ??? Do you use sunscreen? Yes   ??? Tanning bed use? No   ??? Are you easily burned? Yes   ??? Excessive sun exposure? No   ??? Blistering sunburns? No   Social History Narrative   ??? None     Social Determinants of Health     Financial Resource Strain: Not on file   Food Insecurity: Not on file   Transportation Needs: Not on file   Physical Activity: Not on file   Stress: Not on file   Social Connections: Not on file       Review of Systems   Constitutional: Negative for fever.   Respiratory: Negative for shortness of breath.    Cardiovascular: Positive for leg swelling. Negative for chest pain.   Gastrointestinal: Negative for abdominal pain.       Physical Exam     BP 193/78  - Pulse 56  - Temp 36.6 ??C (97.9 ??F)  - Resp 18  - SpO2 98%     Physical Exam  Vitals reviewed.   Constitutional:       General: She is not in acute distress.     Appearance: She is well-developed.   HENT:      Head: Normocephalic.   Eyes:      General: No scleral icterus.  Cardiovascular:      Rate and Rhythm: Normal rate and regular rhythm.      Heart sounds: Normal heart sounds. No murmur heard.  No friction rub. No gallop.    Pulmonary:      Effort: Pulmonary effort is normal. No respiratory distress.      Breath sounds: Normal breath sounds.   Abdominal:      Palpations: Abdomen is soft.      Tenderness: There is no abdominal tenderness.   Musculoskeletal:         General: Normal range of motion.      Cervical back: Normal range of motion and neck supple.      Right lower leg: Edema present.      Left lower leg: Edema present.   Skin:     General: Skin is warm and dry.      Findings: Rash (chronic skin canges) present.   Neurological:      General: No focal deficit present.      Mental Status: She is alert.   Psychiatric:         Behavior: Behavior normal.         ECG     Normal sinus rhythm at a rate of 65.  Nonspecific T wave inversions and changes.  No ST segment elevation.  Normal axis.  QTC is 426.      Radiology     XR Chest Portable   Preliminary Result   Obscuration of the inferior left and right heart borders, could represent pericardial fat, atelectasis, infection.      Question moderate left pleural effusion.      Consider further characterization with two-view chest radiograph.            MDM  Reviewed: vitals  Interpretation: x-ray, ECG and labs         Nicholes Calamity, [place]  07/12/20 0840

## 2020-07-12 DIAGNOSIS — M0600 Rheumatoid arthritis without rheumatoid factor, unspecified site: Principal | ICD-10-CM

## 2020-07-12 LAB — COVID IGG: COVID-19 IGG: NEGATIVE

## 2020-07-12 MED ORDER — AMLODIPINE 5 MG TABLET
ORAL_TABLET | Freq: Every day | ORAL | 3 refills | 90.00000 days | Status: CN
Start: 2020-07-12 — End: 2021-07-12

## 2020-07-12 MED ORDER — ORENCIA CLICKJECT 125 MG/ML SUBCUTANEOUS AUTO-INJECTOR
SUBCUTANEOUS | 3 refills | 0 days | Status: CP
Start: 2020-07-12 — End: ?

## 2020-07-12 MED ORDER — LOSARTAN 100 MG TABLET
ORAL_TABLET | Freq: Every day | ORAL | 0 refills | 30 days | Status: CN
Start: 2020-07-12 — End: 2020-08-11

## 2020-07-12 NOTE — Unmapped (Signed)
[facility] RHEUMATOLOGY CLINIC - PHARMACIST NOTES     Orencia prescription sent to [facility] at the request of MAP team for renewal of mfg assistance.       Chelsea Aus

## 2020-07-15 DIAGNOSIS — M0600 Rheumatoid arthritis without rheumatoid factor, unspecified site: Principal | ICD-10-CM

## 2020-07-23 ENCOUNTER — Other Ambulatory Visit: Payer: Self-pay | Admitting: Gerontology

## 2020-07-23 DIAGNOSIS — N1832 Chronic kidney disease, stage 3b: Secondary | ICD-10-CM

## 2020-07-23 DIAGNOSIS — I10 Essential (primary) hypertension: Secondary | ICD-10-CM

## 2020-07-31 ENCOUNTER — Ambulatory Visit
Admission: RE | Admit: 2020-07-31 | Discharge: 2020-07-31 | Disposition: A | Discharge status: Home / Self Care | Payer: Medicare Other | Source: Ambulatory Visit | Attending: Gerontology | Admitting: Gerontology

## 2020-07-31 ENCOUNTER — Other Ambulatory Visit: Payer: Self-pay

## 2020-07-31 DIAGNOSIS — I10 Essential (primary) hypertension: Secondary | ICD-10-CM | POA: Insufficient documentation

## 2020-07-31 DIAGNOSIS — N1832 Chronic kidney disease, stage 3b: Secondary | ICD-10-CM | POA: Insufficient documentation

## 2020-08-08 MED ORDER — EZETIMIBE 10 MG TABLET
ORAL_TABLET | 0 refills | 0.00000 days
Start: 2020-08-08 — End: ?

## 2020-08-15 NOTE — Unmapped (Signed)
Dermatology Note     Assessment and Plan:      SCCis, hyperkeratotic, right shin  - Patient agreeable to proceeding with IL 5FU  Risks, benefits and adverse effects of biopsy were reviewed including possible mild nausea a day or two after the injection.  After obtaining verbal consent, the area(s) as described below was/were anesthetized using 1% lidocaine with epinephrine (1:500,000) and then treated with curettage. The base was injected with 5-Fluorouracil x 0.2 ml. Post-procedure wound care instructions were reviewed.     Treatment Area (# of treatments by date) Tumor Size (mm)   (1) Right proximal shin SCCis        Neoplasm(ia) of unspecified etiology:  - To help confirm diagnosis, biopsy/biopsies obtained today.   Biopsy (Shave) Procedure Note:   After R/B/A discussed (including scarring, pigment alteration, recurrence, or persistence of the lesion) and verbal consent was obtained, the area was marked, photographed, prepped with alcohol, and anesthetized with lidocaine 2% with epinephrine. Biopsy(ies) performed using a shave technique. Hemostasis was achieved with pressure, aluminum chloride, Monsel's, and/or electrocautery. Area was dressed with petrolatum and bandage. Wound care instructions were provided. We will contact the patient with results when available. Patient agrees to be notified of results by MyChart - even if needs further management.   A) Location: Right foot, dorsal, DDx: BCC vs SCC vs AK vs KA vs SK  B) Location: Right shin, DDx: BCC vs SCC vs AK vs KA vs SK    Hypertrophic Actinic Keratosis(es):   - Discussed premalignant nature of lesions and therefore recommended therapy.   - Patient agrees to cryotherapy. See procedure note for details.  Cryotherapy Procedure Note:   After R/B/A discussed (including scarring, pigment alteration, recurrence, or persistence of the lesion) and verbal consent was obtained, identified lesions were treated with liquid nitrogen x 1 ten second freeze-thaw cycle. The patient tolerated the procedure well and was instructed on post-procedure care.  Total AK's frozen: > 15  Location and number: Right neck (1), left arm (2), right arm/dorsal hand (3), left leg (3), right leg (6)    The patient was advised to call for an appointment should any new, changing ,or symptomatic lesions or a flare develop.     RTC: Return in about 4 weeks (around 09/13/2020). or sooner as needed   _____________________________________________________________________________________    Referring Provider: Referred Self     Chief Complaint     Chief Complaint   Patient presents with   ??? Squamous Cell Carcinoma     pt here for f/u pos t surgery, and new area on rt leg of concern       HPI     Shelly Cooper is a pleasant 79 y.o. female, who presents as a returning patient (last seen 05/17/2020) to [facility] for follow up of  post-surgery.      At last visit, patient had 3 shave biopsies of her right shin (SCCis), left lateral knee (hypertrophic AK), and left fifth digit (SCCis).  We discussed IL 5FU as a treatment option for the lesion of the right shin and Efudex topically to the lesion of the left lateral knee and left 5th digit.    Today patient reports the area treated at her last visit has improved, but there have been new spots that popped up since. They are painful to touch and located on the right shin.    The patient denies any other new/changing lesions or other issues of concern.  Pertinent Past Medical History     History of skin cancer as outlined below:    SCCis, right shin, s/p IL 5FU 05/2020  SCCis, left fifth digit s/p Efudex 05/2020  SCC, right thigh s/p Mohs??07/2019  SCCis, left back 05/2018 s/p [facility] 07/2018  BCCn, right thigh 05/2018 s/p ED&C 07/2018  SCCis, right 4th finger 09/2007  SCC, right upper back, 09/2007    Past Medical History, Family History, Social History, Med List, Allergies, Problem List reviewed in the rooming section of Epic     ROS: Other than symptoms mentioned in the HPI,   No fevers, chills, or other skin complaints.    Physical Examination     General: Well-appearing female, in no acute distress, resting comfortably  Neuro: Alert and oriented, answers questions appropriately  Focal Skin Exam: Per patient request, Focal examination of face, neck, bilateral upper and lower extremities  Skin examination was notable for the following:  Actinic Keratoses: Scaly erythematous macules on the sun exposed areas of neck, bilateral arms, and bilateral legs  - Bilateral lower and upper extremities with multiple erythematous scaly papules, some are hyperkeratotic/hypertrophic  - Right shin with 6mm keratotic papule with surrounding scar        - Neoplasm of uncertain behavior, see details below:    Right foot, dorsal:      Neoplasia (Biopsy A): BCC vs. SCC vs. AK vs. KA vs. SK  Type of biopsy (Biopsy A): Shave  Size of Lesion (Biopsy A): 1.5 cm  Description (Biopsy A): keratotic papule    Right shin:      Neoplasia (Biopsy B): BCC vs. SCC vs. AK vs. KA vs. SK  Type of biopsy (Biopsy B): Shave  Size of Lesion (Biopsy B): 1.5 cm  Description (Biopsy B): keratotic papule    All areas not commented on are within normal limits or unremarkable.    Scribe's Attestation: Inis Sizer, MD obtained and performed the history, physical exam and medical decision making elements that were  entered into the chart. Documentation assistance was provided by me personally, a scribe. Signed by Joylene Draft, Scribe, on August 16, 2020 3:59 PM    ----------------------------------------------------------------------------------------------------------------------  August 24, 2020 11:33 AM. Documentation assistance provided by the Scribe. I was present during the time the encounter was recorded. The information recorded by the Scribe was done at my direction and has been reviewed and validated by me.  ----------------------------------------------------------------------------------------------------------------------

## 2020-08-16 ENCOUNTER — Ambulatory Visit: Admit: 2020-08-16 | Discharge: 2020-08-17 | Payer: MEDICARE

## 2020-08-16 DIAGNOSIS — Z85828 Personal history of other malignant neoplasm of skin: Principal | ICD-10-CM

## 2020-08-16 DIAGNOSIS — D0471 Carcinoma in situ of skin of right lower limb, including hip: Principal | ICD-10-CM

## 2020-08-16 DIAGNOSIS — L570 Actinic keratosis: Principal | ICD-10-CM

## 2020-08-16 DIAGNOSIS — D485 Neoplasm of uncertain behavior of skin: Principal | ICD-10-CM

## 2020-08-16 MED ORDER — FLUOROURACIL 5 % TOPICAL CREAM
Freq: Two times a day (BID) | TOPICAL | 0 refills | 0.00000 days | Status: CP
Start: 2020-08-16 — End: 2020-09-19

## 2020-08-16 NOTE — Unmapped (Addendum)
Chemo (5 Fluorouracil injections):  ?? Rarely patients may experience some mild nausea after the injections - to prevent this, try consuming ginger ale and saltine crackers throughout the day today in addition to your normal meals.  ?? You may experience some mild pain at the injection sites for which you can take tylenol/ibuprofen as needed.     Shave biopsy   A shave biopsy involves numbing a small area of your skin and then obtaining a sample to help [place] with proper diagnosis or skin condition. Biopsy results typically return in 7 to 14 days.    To care for the area: Leave the bandage in place until the morning after your procedure is performed. On a daily basis, carefully remove the bandage, then shower or wash as usual. Allow water to run over the site. Please do not scrub. Carefully dry the area, then apply ointment (some people develop an allergy to Neosporin, so we recommend Vaseline orAquaphor). Cover the site with a fresh bandage. Should any bleeding occur, apply firm pressure for 15 minutes. The treated site will heal best if  a scab never forms (the wound heals by new skin cells traveling from the outside toward the middle-their journey is easier if no scab stands in their way).    Long-term care: the site will be more sensitive than your surrounding skin. Keep it covered, and remember to apply sunscreen every day to all your exposed skin. A scar may remain which is lighter or pinker than your normal skin. Your body will continue to improve your scar for up to one year.    Infection following this procedure is rare. However, if you are worried about the appearance of your site, contact your doctor. Complete healing may take up to one month. We have a physician on call at all times. If you have any concerns about the site, please call our clinic at [phone]    We froze some spots today with cryotherapy. These spots will become red, angry, crusty and then fall off, see post procedure care details below.    Cryosurgery  Cryosurgery (???freezing???) uses liquid nitrogen to destroy certain types of skin lesions. Lowering the temperature of the lesion in a small area surrounding skin destroys the lesion. Immediately following cryosurgery, you will notice redness and swelling of the treatment area. Blistering or weeping may occur, lasting approximately one week which will then be followed by crusting. Most areas will heal completely in 10 to 14 days.    Wash the treated areas daily. Allow soap and water to run over the areas, but do not scrub. Should a scab or crust form, allow it to fall off on its own. Do not remove or pick at it. Application of an ointment  and a bandage may make you feel more comfortable, but it is not necessary. Some people develop an allergy to Neosporin, so we recommend that Vaseline or  Aquaphor be used.    The cryotherapy site will be more sensitive than your surrounding skin. Keep it covered, and remember to apply sunscreen every day to all your sun exposed skin. A scar may remain which is lighter or pinker than your normal skin. Your body will continue to improve your scar for up to one year; however a light-colored scar may remain.    Infection following cryotherapy is rare. However if you are worried about the appearance of the treated area, contact your doctor. We have a physician on call at all times. If you have  any concerns about the site, please call our clinic at [phone]

## 2020-08-19 NOTE — Unmapped (Signed)
Lesion A: treated with IL 5FU on 08/16/20  Lesion B: clinical monitoring  Lesion C: topical 5FU, clinical follow up    Tracking complete for all

## 2020-08-22 NOTE — Unmapped (Signed)
Patient notified via MyChart.  We will plan to reevaluate at her follow-up visit already scheduled for 1 month.      Final Diagnosis  A:  Right dorsal foot, shave  - Actinic keratosis, superficial biopsy  - The base of the lesion is not represented in the biopsy specimen and associated invasive malignancy cannot be excluded  - If suspicion of malignancy remains, deeper biopsy may be considered  ??  B:  Right shin, shave  - Keratoacanthoma, present in base of biopsy    Tracking needed: Follow-up in 1 month

## 2020-09-06 MED ORDER — IPRATROPIUM BROMIDE 21 MCG (0.03 %) NASAL SPRAY
0.00000 days
Start: 2020-09-06 — End: ?

## 2020-09-19 ENCOUNTER — Ambulatory Visit: Admit: 2020-09-19 | Discharge: 2020-09-20 | Payer: MEDICARE

## 2020-09-19 DIAGNOSIS — L858 Other specified epidermal thickening: Principal | ICD-10-CM

## 2020-09-19 DIAGNOSIS — L570 Actinic keratosis: Principal | ICD-10-CM

## 2020-09-19 DIAGNOSIS — L578 Other skin changes due to chronic exposure to nonionizing radiation: Principal | ICD-10-CM

## 2020-09-19 MED ORDER — FLUOROURACIL 5 % TOPICAL CREAM
Freq: Two times a day (BID) | TOPICAL | 2 refills | 0.00000 days | Status: CP
Start: 2020-09-19 — End: 2021-09-19

## 2020-09-19 NOTE — Unmapped (Signed)
Dermatology Note     Assessment and Plan:      Skin Check/Evaluation of Benign Lesions/ findings:  Seborrheic Keratosis (es)- no irritation noted   - Reassurance provided regarding the benign appearance of lesions noted on exam today; no treatment is indicated in the absence of symptoms/changes.  - Patient education provided.  - Encouraged regular self-skin monitoring as well as routine clinical examinations for surveillance.  - Reinforced importance of photoprotective strategies including liberal and frequent sunscreen use of a broad spectrum SPF 30 or greater, use of protective clothing, and sun avoidance for prevention of cutaneous malignancy and photoaging.    Actinic Keratoses, bilateral legs:   - Diffuse actinic damage  - Continue topical 5FU on lesions on legs bid x 3-4 weeks total    Keratoacanthoma, right leg:   - Has regrown and tender following biopsy last visit  - Agreeable to [facility] today    ED&C Procedure Note:  INDICATION: keratoacanthoma  After verbal informed consent was obtained, the lesion was prepped and anesthesized with 1% lidocaine with epinephrine. Curettage was then performed with a #4 currette, followed by electrodessication.  This was repeated for a total of 3 cycles and then dressed with petrolatum and a bandage.  Wounds care instructions were given.  The final lesion size after first pass of the currette was 14 mm.    Personal history of non-melanoma skin cancer    No evidence of recurrence at this time.      - Counseled patient on the importance of regular self-monitoring as well as clinical skin examinations as scheduled.       The patient was advised to call for an appointment should any new, changing ,or symptomatic lesion develop.     RTC: Return in about 2 months (around 11/17/2020). or sooner as needed   _____________________________________________________________________________________    Referring Provider: Referred Self     Chief Complaint     Chief Complaint   Patient presents with   ??? Follow-up     places on front lower right leg from biopsy in decemeber       HPI     Shelly Cooper is a pleasant 80 y.o. female, who presents as a returning patient (last seen 08/16/2020) to [facility] for a focused skin exam for evaluation of lesion (s).      At the last visit we biopsied an AK of the right dorsal foot and an KA of the right shin.    She presents today for evaluation after starting topical 5FU on legs.  She has been using the treatment for about 1-2 weeks and is beginning to notice some inflammation.    The patient denies any other new or changing lesions or areas of concern.     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    Physical Examination     General: Well-appearing female, in no acute distress, resting comfortably  Neuro: Alert and oriented, answers questions appropriately  Focal Skin Exam: Per patient request, Focal examination of bilateral legs performed.  Skin examination was notable for the following:  Actinic Keratoses: Scaly erythematous macules on the sun exposed areas of bilateral legs and dorsal feet  Surgical Site: Well healed scar on the right shin without nodularity, re-pigmentation,  scaling, erythema, or regrowth  Right shin with a 13mm keratotic papule         All areas not commented on are within normal limits or unremarkable.

## 2020-10-07 NOTE — Unmapped (Signed)
Received letter from [employer] stating that Medicare Part D only covers 5 Fluorouracil 5% cream in the quantity of 40g for 30 days. I don't see that the letter contains any alternatives that must be tried.  Furthermore, the prescription is for a 40g tube for a 30 day supply.    I will ask our PA team to look into it.

## 2020-11-01 ENCOUNTER — Encounter: Admit: 2020-11-01 | Discharge: 2020-11-02 | Payer: MEDICARE

## 2020-11-01 DIAGNOSIS — L821 Other seborrheic keratosis: Principal | ICD-10-CM

## 2020-11-01 DIAGNOSIS — Z85828 Personal history of other malignant neoplasm of skin: Principal | ICD-10-CM

## 2020-11-01 DIAGNOSIS — L570 Actinic keratosis: Principal | ICD-10-CM

## 2020-11-01 NOTE — Unmapped (Signed)
Dermatology Note     Assessment and Plan:      Evaluation of Benign Lesions/ findings:    Seborrheic Keratosis (es)- no irritation noted   - Reassurance provided regarding the benign appearance of lesions noted on exam today; no treatment is indicated in the absence of symptoms/changes  - Patient education provided  - Encouraged regular self-skin monitoring as well as routine clinical examinations for surveillance  - Reinforced importance of photoprotective strategies including sunscreen use, use of protective clothing, and sun avoidance for prevention of cutaneous malignancy and photoaging    Hypertrophic actinic keratoses, bilateral legs, right dorsal hand and left upper arm:   - Diffuse actinic damage  - Continue topical 5FU on lesions on legs bid x 3-4 weeks total  - Jointly elected to proceed with cryotherapy today.     Cryotherapy Procedure Note:   After R/B/A discussed (including scarring, pigment alteration, recurrence, or persistence of the lesion) and verbal consent was obtained, identified lesions were treated with liquid nitrogen x 1 ten second freeze-thaw cycle. The patient tolerated the procedure well and was instructed on post-procedure care.  Total AK's frozen: 11  Location and number: right shin (4), left shin (1), right dorsal foot (1), left dorsal wrist (1), right 4th finger (1), right dorsal hand (1), right thumb (1), left upper arm (1)    Keratoacanthoma, right leg:   - Biopsy on 08/16/20  - ED&C on 09/19/20   - Well healed scar on exam today     Personal history of non-melanoma skin cancer    No evidence of recurrence at this time.      - Counseled patient on the importance of regular self-monitoring as well as clinical skin examinations as scheduled.     The patient was advised to call for an appointment should any new, changing ,or symptomatic lesions or a flare develop.     RTC: Return in about 2 months (around 01/01/2021) for follow up. or sooner as needed _____________________________________________________________________________________    Referring Provider: Benjie Karvonen, MD     Chief Complaint     Chief Complaint   Patient presents with   ??? Biopsy     & ED&C f/u lower looks better        HPI     Shelly Cooper is a pleasant 80 y.o. female, who presents as a returning patient (last seen by Dr.Adom Schoeneck on 09/19/2020) to [facility] for follow up of hypertrophic actinic keratoses and ED&C of a KA on the right shin.      At last visit patient was continued on topical 5FU to actinic keratoses on her legs x3-4 weeks. Additionally, she underwent ED&C procedure to a keratoacanthoma of the right leg.      Today patient reports that the [facility] site on her right leg, where she had a keratoacanthoma treated, has healed well.     Additionally, she notes red scaly lesions on her bilateral legs and feet despite continuing to treat with Efudex cream. One particular red, scaly lesion on her right foot has grown so thick she reports she shaved it down herself. She also notes a scaly lesion on her right hand and similar lesions on her left arm.     The patient denies any other new/changing lesions or other issues of concern.     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    Physical Examination     General: Well-appearing female, in no acute distress, resting comfortably  Neuro: Alert and oriented, answers questions appropriately  Focal Skin Exam: Per patient request, Focal examination of chest, bilateral legs and feet, bilateral arms and hands  Skin examination was notable for the following:  Seborrheic Keratoses: Multiple stuck-on appearing  keratotic papules on the trunk and extremities. none irritated w redness, crusting, edema, and /or partial avulsion    - Right upper medial shin with violaceous macule, site of healing ED&C  - Right shin, left shin, right dorsal foot, right dorsal hand and left upper arm with scattered erythematous keratotic papules and plaques     All areas not commented on are within normal limits or unremarkable.    Scribe's Attestation: Benjie Karvonen, MD obtained and performed the history, physical exam and medical decision making elements that were entered into the chart. Signed by Ward Chatters, Scribe, on November 01, 2020 at 1:30 PM.    ----------------------------------------------------------------------------------------------------------------------  November 01, 2020 8:03 PM. Documentation assistance provided by the Scribe. I was present during the time the encounter was recorded. The information recorded by the Scribe was done at my direction and has been reviewed and validated by me.  ----------------------------------------------------------------------------------------------------------------------

## 2020-11-01 NOTE — Unmapped (Addendum)
1. Continue applying Efudex cream to lesions on legs twice daily.     2. We froze some spots with cryotherapy. These spots will become red, angry, crusty and then fall off, see post procedure care details below.    Cryosurgery  Cryosurgery (???freezing???) uses liquid nitrogen to destroy certain types of skin lesions. Lowering the temperature of the lesion in a small area surrounding skin destroys the lesion. Immediately following cryosurgery, you will notice redness and swelling of the treatment area. Blistering or weeping may occur, lasting approximately one week which will then be followed by crusting. Most areas will heal completely in 10 to 14 days.    Wash the treated areas daily. Allow soap and water to run over the areas, but do not scrub. Should a scab or crust form, allow it to fall off on its own. Do not remove or pick at it. Application of an ointment  and a bandage may make you feel more comfortable, but it is not necessary. Some people develop an allergy to Neosporin, so we recommend that Vaseline or  Aquaphor be used.    The cryotherapy site will be more sensitive than your surrounding skin. Keep it covered, and remember to apply sunscreen every day to all your sun exposed skin. A scar may remain which is lighter or pinker than your normal skin. Your body will continue to improve your scar for up to one year; however a light-colored scar may remain.    Infection following cryotherapy is rare. However if you are worried about the appearance of the treated area, contact your doctor. We have a physician on call at all times. If you have any concerns about the site, please call our clinic at [phone]

## 2020-11-28 ENCOUNTER — Ambulatory Visit: Admit: 2020-11-28 | Discharge: 2020-11-29 | Payer: MEDICARE

## 2020-11-28 DIAGNOSIS — M7632 Iliotibial band syndrome, left leg: Principal | ICD-10-CM

## 2020-11-28 DIAGNOSIS — M810 Age-related osteoporosis without current pathological fracture: Principal | ICD-10-CM

## 2020-11-28 DIAGNOSIS — Z79899 Other long term (current) drug therapy: Principal | ICD-10-CM

## 2020-11-28 DIAGNOSIS — M0600 Rheumatoid arthritis without rheumatoid factor, unspecified site: Principal | ICD-10-CM

## 2020-11-28 LAB — CBC W/ AUTO DIFF
BASOPHILS ABSOLUTE COUNT: 0 10*9/L (ref 0.0–0.1)
BASOPHILS RELATIVE PERCENT: 0.7 %
EOSINOPHILS ABSOLUTE COUNT: 0.2 10*9/L (ref 0.0–0.5)
EOSINOPHILS RELATIVE PERCENT: 3.9 %
HEMATOCRIT: 42.4 % (ref 34.0–44.0)
HEMOGLOBIN: 14.2 g/dL (ref 11.3–14.9)
LYMPHOCYTES ABSOLUTE COUNT: 1.6 10*9/L (ref 1.1–3.6)
LYMPHOCYTES RELATIVE PERCENT: 25.2 %
MEAN CORPUSCULAR HEMOGLOBIN CONC: 33.6 g/dL (ref 32.0–36.0)
MEAN CORPUSCULAR HEMOGLOBIN: 29.9 pg (ref 25.9–32.4)
MEAN CORPUSCULAR VOLUME: 89.2 fL (ref 77.6–95.7)
MEAN PLATELET VOLUME: 7.3 fL (ref 6.8–10.7)
MONOCYTES ABSOLUTE COUNT: 0.5 10*9/L (ref 0.3–0.8)
MONOCYTES RELATIVE PERCENT: 8.4 %
NEUTROPHILS ABSOLUTE COUNT: 3.9 10*9/L (ref 1.8–7.8)
NEUTROPHILS RELATIVE PERCENT: 61.8 %
NUCLEATED RED BLOOD CELLS: 0 /100{WBCs} (ref <=4)
PLATELET COUNT: 273 10*9/L (ref 150–450)
RED BLOOD CELL COUNT: 4.76 10*12/L (ref 3.95–5.13)
RED CELL DISTRIBUTION WIDTH: 13.9 % (ref 12.2–15.2)
WBC ADJUSTED: 6.4 10*9/L (ref 3.6–11.2)

## 2020-11-28 LAB — SEDIMENTATION RATE: ERYTHROCYTE SEDIMENTATION RATE: 6 mm/h (ref 0–30)

## 2020-11-28 LAB — COMPREHENSIVE METABOLIC PANEL
ALBUMIN: 3.9 g/dL (ref 3.4–5.0)
ALKALINE PHOSPHATASE: 131 U/L — ABNORMAL HIGH (ref 46–116)
ALT (SGPT): 13 U/L (ref 10–49)
ANION GAP: 7 mmol/L (ref 5–14)
AST (SGOT): 20 U/L (ref <=34)
BILIRUBIN TOTAL: 0.5 mg/dL (ref 0.3–1.2)
BLOOD UREA NITROGEN: 21 mg/dL (ref 9–23)
BUN / CREAT RATIO: 24
CALCIUM: 10.1 mg/dL (ref 8.7–10.4)
CHLORIDE: 109 mmol/L — ABNORMAL HIGH (ref 98–107)
CO2: 25.9 mmol/L (ref 20.0–31.0)
CREATININE: 0.89 mg/dL — ABNORMAL HIGH
EGFR CKD-EPI AA FEMALE: 71 mL/min/{1.73_m2} (ref >=60)
EGFR CKD-EPI NON-AA FEMALE: 61 mL/min/{1.73_m2} (ref >=60)
GLUCOSE RANDOM: 117 mg/dL (ref 70–179)
POTASSIUM: 4.5 mmol/L (ref 3.4–4.8)
PROTEIN TOTAL: 6.5 g/dL (ref 5.7–8.2)
SODIUM: 142 mmol/L (ref 135–145)

## 2020-11-28 LAB — C-REACTIVE PROTEIN: C-REACTIVE PROTEIN: <4 mg/L (ref <=10.0)

## 2020-11-28 NOTE — Unmapped (Signed)
Rheumatology Clinic Follow-up Note     Assessment/Plan:    Shelly Cooper is a 80 y.o.  female with a past medical history of seronegative RA and non-melanoma skin cancer s/p pericarditis and pleuritis in 2018 now on abatacept weekly injection. Overall her RA well controlled and on remission.    1. Seronegative rheumatoid arthritis (CMS-HCC)  - CDAI 2 - remission  - Continue with orenica 125mg  subcutaneous weekly   - CBC w/ Differential  - C-reactive protein  - Sedimentation Rate  - 25 OH Vit D  - Comprehensive Metabolic Panel      2. Osteoporosis, unspecified osteoporosis type, unspecified pathological fracture presence  - DEXA 05/2019 - showing osteoporosis lumbar spine T score of -2.9  - Will re-order reclast infusion  - Vitamin D ordered    3. Iliotibial band syndrome of left side  - Offered PT but patient opted for ITB exercises for now      Swollen Joint Count (0-28): 0  Tender Joint Count (0-28): 0  Patient Global Assessment of Disease Activity (1-10): 1  Evaluator Global Assessment of Disease (1-10): 1  CDAI Score: 2    Return in about 1 month (around 12/28/2020).    Shelly Cooper was seen today for chronic condition follow-up.    Diagnoses and all orders for this visit:    Seronegative rheumatoid arthritis (CMS-HCC)  -     CBC w/ Differential  -     C-reactive protein  -     Sedimentation Rate  -     25 OH Vit D  -     Comprehensive Metabolic Panel    Osteoporosis, unspecified osteoporosis type, unspecified pathological fracture presence    High risk medication use  -     CBC w/ Differential  -     C-reactive protein  -     Sedimentation Rate  -     25 OH Vit D  -     Comprehensive Metabolic Panel    Iliotibial band syndrome of left side         Health Maintenance  Routine health maintenance discussed      - CDC recommends all immunosuppressed adults receive vaccination against pneumonococcus.. Adults 19 years or older who have not received any pneumococcal vaccine, should get a dose of PCV13 first and should also continue to receive the recommended doses of PPSV23. Adults 19 years or older who have previously received one or more doses of PPSV23, should also receive a dose of PCV13 and should continue to receive the remaining recommended doses of PPSV23.          - PCV 13: 04/03/2014          - PCV 23: 10/06/2016      - Plaquenil ophthalmology monitoring should be performed annually      - DEXA Scan: 05/2019      - Vitamin D: 1000 IU daily       - Annual flu vaccination:  yes      - COVID - x2 not interested in booster      - EVUSHELD - not intersting     Patient understands and agrees to plan above.    Patient was seen and discussed with attending physician, Dr. Meyer Russel, MD  Rheumatology Fellow PGY4    Primary Care Provider: Lorenso Quarry, NP      Interval HPI.  Last  visti 03/28/2020. Overall her RA well controlled with orencia once week during  that visit.  Discussed starting reclast. Did not start cause of cost and other logistics. Patient stated she did not hear back from pharmacy.  07/2020 admitted for NSTEMI type II from HTN. No joint pain, swelling but minimal stiffness. She c/o of left hip pain for the past year exacerbated by sleeping on that side.  Overall, No concerns today. She denies fever/chills, visual deficits or pain, chest pain, dyspnea, abdominal pain, unintentional weight loss, diarrhea or constipation. No new rashes.     Initial HPI:  Shelly Cooper is a 80 y.o.  female with a past medical history of  seronegative RA and non-melanoma skin cancer s/p pericarditis and pleuritis in 2018 now on abatacept weekly injection.    Prior Rheumatologic History: Probable seronegative inflammatory arthritis (RA).  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  Pt also has a history of non-melanoma skin cancer but was cleared by her dermatologist to start Enbrel, which was started in November 2016 but discontinued in May 2017 due to worsening squamous cell skin lesions. She was continued hydroxychloroquine and started on Sulfasalazine in May 2017 but we have not been able to titrate dose beyond 500 mg po bid due to cough and concerns of possible side effects. She was last seen by myself in November 2017 following a recent shingles flare with evidence for increased LE edema so no medication adjustments made at that time. She called in December 2017 stating that the sulfasalazine was causing swelling in the face and so advised her to stop. She reported 22lb weight loss after stopping the sulfasalazine. She was then seen by Carlus Pavlov PA-C in February 2018 and had recently been diagnosed with pericarditis and started on colchicine. 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 At follow-up in June 2018, reported tapering off colchicine and medrol dose pack for pleuritis and pericarditis. She reported compliance with hydroxychloroquine 200 mg qd. We had discussed starting Actemra/Tocilizumab for RA with pericarditis and pleuritis and patient finally agreed to start following cardiology approval in October 2018. Due to $500 copay cost following first infusion, patient declined continuing with tocilizumab infusions. She was not interested in pursuing [place] injections with tocilizumab or other therapy. She then was started on tofacitinib, but one week after starting she reported abdominal pain and h/o diverticulitis so this was discontinued.  Started Orencia in October 2019. Hydroxychloroquine discontinued in September 2020    Review of Systems:  Positive findings noted above, otherwise a 14 point review of systems was reviewed and negative    Past Medical, Surgical, Family and Social History reviewed and updated per EMR     Allergies:  Aspirin and Lyrica  [pregabalin]    Medications:   Outpatient Encounter Medications as of 11/28/2020   Medication Sig Dispense Refill   ??? abatacept (ORENCIA CLICKJECT) 125 mg/mL AtIn Inject the contents of 1 pen (125 mg) under the skin once a week. 12 mL 3   ??? amLODIPine (NORVASC) 5 MG tablet Take 1 tablet (5 mg total) by mouth daily. 90 tablet 3   ??? clobetasoL (TEMOVATE) 0.05 % ointment Apply topically Two (2) times a day. 60 g 1   ??? clopidogrel (PLAVIX) 75 mg tablet Take 75 mg by mouth daily.     ??? fluorouraciL (EFUDEX) 5 % cream Apply topically Two (2) times a day. 40 g 2   ??? ipratropium (ATROVENT) 21 mcg (0.03 %) nasal  spray      ??? lansoprazole (PREVACID) 30 MG capsule Take 1 capsule by mouth once daily.     ??? losartan (COZAAR) 100 MG tablet Take 100 mg by mouth daily.      ??? torsemide (DEMADEX) 20 MG tablet Take 20 mg by mouth daily as needed.        No facility-administered encounter medications on file as of 11/28/2020.          Objective   Vitals:    11/28/20 1113   BP: 146/77   BP Site: L Arm   BP Position: Sitting   BP Cuff Size: Medium   Pulse: 65   Temp: 36.2 ??C   TempSrc: Temporal   Weight: 82.9 kg (182 lb 12.8 oz)   Height: 162.6 cm (5' 4)       Physical Exam  General: well appearing, no acute distress  Eyes: EOMI, normal conjunctivae   ENT: MMM.  Oropharynx without any erythema or exudate.  No oral or nasal ulcers.  Neck: supple. No cervical lymphadenopathy  Cardiovascular: Regular rate and rhythm. No murmurs, rubs or gallops.   Pulmonary: Clear to auscultation bilaterally. Normal work of breathing.  Skin: multiple keratotic papules noted in extremities otherwise no rash, lesions, breakdown. No purpura or petechiae. No digital ulcers.   Extremities: Warm and well perfused, no cyanosis, clubbing or edema  Musculoskeletal: No synovitis or tenderness to palpation in the hands, wrists, elbows, shoulders, knees, ankles, or feet. Full ROM throughout. TTP in left hip radiating from down to lateral knee.  Neurologic: Cranial nerves grossly intact, strength 5/5 throughout.  Normal sensation  Psychiatric: Normal mood and affect.    Labs:  Lab Results   Component Value Date WBC 6.4 11/28/2020    RBC 4.76 11/28/2020    HGB 14.2 11/28/2020    HCT 42.4 11/28/2020    MCV 89.2 11/28/2020    MCH 29.9 11/28/2020    MCHC 33.6 11/28/2020    RDW 13.9 11/28/2020    PLT 273 11/28/2020    MPV 7.3 11/28/2020        Chemistry        Component Value Date/Time    NA 142 11/28/2020 1200    NA 144 07/31/2014 1103    K 4.5 11/28/2020 1200    K 3.7 07/31/2014 1103    CL 109 (H) 11/28/2020 1200    CL 104 07/31/2014 1103    CO2 25.9 11/28/2020 1200    CO2 29 07/31/2014 1103    BUN 21 11/28/2020 1200    BUN 20 07/31/2014 1103    CREATININE 0.89 (H) 11/28/2020 1200    CREATININE 0.90 10/30/2014 1534    GLU 117 11/28/2020 1200        Component Value Date/Time    CALCIUM 10.1 11/28/2020 1200    CALCIUM 9.4 07/31/2014 1103    ALKPHOS 131 (H) 11/28/2020 1200    ALKPHOS 120 04/03/2014 0936    AST 20 11/28/2020 1200    AST 25 10/30/2014 1534    ALT 13 11/28/2020 1200    ALT 22 10/30/2014 1534    BILITOT 0.5 11/28/2020 1200    BILITOT 0.6 04/03/2014 1610          Lab Results   Component Value Date    ANA POSITIVE (A) 01/26/2013    PAT1 see below 01/26/2013       Lab Results   Component Value Date    C3 129 05/11/2013    C4 36 05/11/2013  CKTOTAL 54 01/26/2013    CRP <4.0 11/28/2020    ESR 6 11/28/2020       Lab Results   Component Value Date    CREATUR 72.7 09/15/2016       No results found for: EJO1, ERNP, ESCL, ESMA, ESSA, ESSB    Lab Results   Component Value Date    HBSAG Nonreactive 05/17/2015    HEPBSAB Nonreactive 05/17/2015    QFTTBGOLD Negative 05/17/2015    HEPCAB Nonreactive 05/17/2015       Lab Results   Component Value Date    NITRITE Negative 09/15/2016    PROTEINUA Negative 09/15/2016    WBC 6.4 11/28/2020    BLOODU Negative 09/15/2016    KETONESU Negative 09/15/2016       No results found for: ANCA, ANCAIFA, MPO, MPOQT, PR3ELISA

## 2020-11-28 NOTE — Unmapped (Addendum)
Please consider getting the booster for COVID-19. If you do decide to get the booster, please hold the Orencia for 1-2 weeks after the vaccine shot. I do recommend you get the booster particularly if you are interested in receiving Evusheld.    You are a candidate for Evusheld. This is a medication still in clinical trial and only allowed for high risk individuals like yourself on FDA emergency use action approval. See below for additional information on this medication.    If you catch COVID-19, you should reach out to me immediately so we can treat.    Your Rheumatoid Arthritis appears well controlled. Continue Orencia.    Would like to get you the Reclast infusion for your bone as we discussed before.      Patient Education        zoledronic acid  Pronunciation:  ZOE le DRON ik AS id  Brand:  Reclast, Zometa  What is the most important information I should know about zoledronic acid?  Do not use if you are pregnant. Use effective birth control, and ask your doctor how long to prevent pregnancy after you stop using zoledronic acid.  Zoledronic acid can cause serious kidney problems,  especially if you are dehydrated, if you take diuretic medicine, or if you already have kidney disease. Call your doctor if you urinate less than usual, if you have swelling in your feet or ankles, or if you feel tired or short of breath.  Also call your doctor if you have muscle spasms, numbness or tingling (in hands and feet or around the mouth), new or unusual hip pain, or severe pain in your joints, bones, or muscles.  What is zoledronic acid?  Reclast and Zometa are two different brands of zoledronic acid.  Reclast is used to treat or prevent osteoporosis caused by menopause, or steroid use. This medicine also increases bone mass in men with osteoporosis. Reclast is for use when you have a high risk of bone fracture.  Reclast is also used to treat Paget's disease of bone.  Zometa is used to treat high blood levels of calcium caused by cancer (also called hypercalcemia of malignancy). This medicine also treats multiple myeloma (a type of bone marrow cancer) or bone cancer that has spread from elsewhere in the body.  You should not use Reclast and Zometa at the same time.   Zoledronic acid may also be used for purposes not listed in this medication guide.  What should I discuss with my healthcare provider before receiving zoledronic acid?  You should not be treated with zoledronic acid if you are allergic to it.  You also should not receive Reclast if you have:  ?? low levels of calcium in your blood (hypocalcemia); or  ?? severe kidney disease.  You should not be treated with zoledronic acid if are currently using any other bisphosphonate (such as alendronate, etidronate, ibandronate, pamidronate, risedronate, or tiludronate).  Tell your doctor if you have ever had:  ?? kidney disease;  ?? hypocalcemia;  ?? thyroid or parathyroid surgery;  ?? surgery to remove part of your intestine;  ?? asthma caused by taking aspirin;  ?? any condition that makes it hard for your body to absorb nutrients from food (malabsorption);  ?? a dental problem (you may need a dental exam before you receive zoledronic acid);  ?? if you are dehydrated; or  ?? if you take a diuretic or water pill.  Zoledronic acid can cause serious kidney problems,  especially if you  are dehydrated, if you take diuretic medicine, or if you already have kidney disease.  This medicine may cause jaw bone problems (osteonecrosis). The risk is highest in people with cancer, blood cell disorders, pre-existing dental problems, or people treated with steroids, chemotherapy, or radiation. Ask your doctor about your own risk.  You may need to have a negative pregnancy test before starting this treatment.  Do not use if you are pregnant. This medicine may harm an unborn baby or cause birth defects. Zoledronic acid can remain in your body for weeks or years after your last dose. Use effective birth control to prevent pregnancy while using this medicine. Talk with your doctor about the need to prevent pregnancy after you stop using zoledronic acid.  This medicine may affect fertility (ability to have children) in women. However, it is important to use birth control to prevent pregnancy because zoledronic acid can harm an unborn baby.  You should not breastfeed while using zoledronic acid.  How is zoledronic acid given?  Zoledronic acid is given as an infusion into a vein. A healthcare provider will give you this injection.  Zoledronic acid is sometimes given as a single dose only one time. It may also be given once every 1 or 2 years. How often you receive zoledronic acid will depend on why you are using this medicine. Follow your doctor's instructions.  Drink at least 2 glasses of water within a few hours before your injection to keep from getting dehydrated.   You will need frequent medical tests.  Pay special attention to your dental hygiene while using zoledronic acid. Brush and floss your teeth regularly. If you need to have any dental work (especially surgery), tell the dentist ahead of time that you are using zoledronic acid.  Zoledronic acid is only part of a complete program of treatment that may also include diet changes and taking calcium and vitamin supplements. Follow your doctor's instructions very closely.  Your doctor will determine how long to treat you with this medicine. Zoledronic acid is often given for only 3 to 5 years.  What happens if I miss a dose?  Call your doctor for instructions if you miss an appointment for your zoledronic acid injection.  What happens if I overdose?  Seek emergency medical attention or call the Poison Help line at [phone].  What should I avoid while receiving zoledronic acid?  Avoid smoking, or try to quit. Smoking can reduce your bone mineral density, making fractures more likely.  Avoid drinking large amounts of alcohol. Heavy drinking can also cause bone loss.  What are the possible side effects of zoledronic acid?  Get emergency medical help if you have signs of an allergic reaction: hives; wheezing, chest tightness, trouble breathing; swelling of your face, lips, tongue, or throat.  Call your doctor at once if you have:  ?? new or unusual pain in your thigh or hip;  ?? jaw pain or numbness, red or swollen gums, loose teeth, or slow healing after dental work;  ?? severe joint, bone, or muscle pain;  ?? kidney problems --little or no urination, swelling in your feet or ankles, feeling tired;  ?? low red blood cells (anemia) --pale skin, unusual tiredness, feeling light-headed or short of breath, cold hands and feet; or  ?? low calcium levels --muscle spasms or contractions, numbness or tingly feeling (around your mouth, or in your fingers and toes).  Serious side effects on the kidneys may be more likely in older adults.  Common  side effects may include:  ?? trouble breathing;  ?? nausea, vomiting, diarrhea, constipation;  ?? bone pain, muscle or joint pain;  ?? fever or other flu symptoms;  ?? tiredness;  ?? eye pain or swelling;  ?? pain in your arms or legs;  ?? headache; or  ?? anemia.  This is not a complete list of side effects and others may occur. Call your doctor for medical advice about side effects. You may report side effects to FDA at 1-800-FDA-1088.  What other drugs will affect zoledronic acid?  Zoledronic acid can harm your kidneys, especially if you also use certain medicines for infections, cancer, osteoporosis, organ transplant rejection, bowel disorders, high blood pressure, or pain or arthritis (including Advil, Motrin, and Aleve).  Other drugs may affect zoledronic acid, including prescription and over-the-counter medicines, vitamins, and herbal products. Tell your doctor about all your current medicines and any medicine you start or stop using.  Where can I get more information?  Your doctor or pharmacist can provide more information about zoledronic acid.  Remember, keep this and all other medicines out of the reach of children, never share your medicines with others, and use this medication only for the indication prescribed.   Every effort has been made to ensure that the information provided by [employer] ('[employer]') is accurate, up-to-date, and complete, but no guarantee is made to that effect. Drug information contained herein may be time sensitive. [employer] information has been compiled for use by healthcare practitioners and consumers in the [place] and therefore [employer] does not warrant that uses outside of the [place] are appropriate, unless specifically indicated otherwise. [employer]'s drug information does not endorse drugs, diagnose patients or recommend therapy. [employer]'s drug information is an Investment [employer], corporate to assist licensed healthcare practitioners in caring for their patients and/or to serve consumers viewing this service as a supplement to, and not a substitute for, the expertise, skill, knowledge and judgment of healthcare practitioners. The absence of a warning for a given drug or drug combination in no way should be construed to indicate that the drug or drug combination is safe, effective or appropriate for any given patient. [employer] does not assume any responsibility for any aspect of healthcare administered with the aid of information [employer] provides. The information contained herein is not intended to cover all possible uses, directions, precautions, warnings, drug interactions, allergic reactions, or adverse effects. If you have questions about the drugs you are taking, check with your doctor, nurse or pharmacist.  Copyright [phone] [employer] Version: 18.01. Revision date: 10/27/2019.  Care instructions adapted under license by [facility]. If you have questions about a medical condition or this instruction, always ask your healthcare professional. [employer] disclaims any warranty or liability for your use of this information.         Patient Education     Fact Sheet for Patients, Parents And Caregivers   Emergency Use Authorization (EUA) of EVUSHELD???   (tixagevimab co-packaged with cilgavimab)   for Coronavirus Disease 2019 (COVID-19)    You are being given this Fact Sheet because your healthcare provider believes it is necessary to provide you with EVUSHELD (tixagevimab co-packaged with cilgavimab) for pre-exposure prophylaxis for prevention of coronavirus disease 2019 (COVID-19) caused by the SARS-CoV-2 virus.    This Fact Sheet contains information to help you understand the potential risks and potential benefits of taking EVUSHELD, which you have received or may receive.    The U.S. Food and Drug Administration (  FDA) has issued an Emergency Use Authorization (EUA) to make EVUSHELD available during the COVID-19 pandemic (for more details about an EUA please see What is an Emergency Use Authorization? at the end of this document). EVUSHELD is not an FDA-approved medicine in the [place].    Read this Fact Sheet for information about EVUSHELD. Talk to your healthcare provider if you have any questions. It is your choice to receive or not receive EVUSHELD.    What is COVID-19?  COVID-19 is caused by a virus called a coronavirus. You can get COVID-19 through close contact with another person who has the virus.    COVID-19 illnesses have ranged from very mild (including some with no reported symptoms) to severe, including illness resulting in death. While information so far suggests that most COVID-19 illness is mild, serious illness can happen and may cause some of your other medical conditions to become worse. Older people and people of all ages with severe, long-lasting (chronic) medical conditions like heart disease, lung disease, and diabetes, for example, seem to be at higher risk of being hospitalized for COVID-19.    What is EVUSHELD (tixagevimab co-packaged with cilgavimab)?  EVUSHELD is an investigational medicine used in adults and adolescents (72 years of age and older who weigh at least 88 pounds [40 kg]) for pre-exposure prophylaxis for prevention of COVID-19 in persons who are:  ?? not currently infected with SARS-CoV-2 and who have not had recent known close contact with someone who is infected with SARS-CoV-2 and  ?? Who have moderate to severe immune compromise due to a medical condition or have received immunosuppressive medicines or treatments and may not mount an adequate immune response to COVID-19 vaccination or  ?? For whom vaccination with any available COVID-19 vaccine, according to the approved or authorized schedule, is not recommended due to a history of severe adverse reaction (such as severe allergic reaction) to a COVID-19 vaccine(s) or COVID-19 vaccine ingredient(s).    EVUSHELD is investigational because it is still being studied. There is limited information known about the safety and effectiveness of using EVUSHELD for pre-exposure prophylaxis for prevention of COVID-19. EVUSHELD is not authorized for post-exposure prophylaxis for prevention of COVID-19.    The FDA has authorized the emergency use of EVUSHELD for pre-exposure prophylaxis for prevention of COVID-19 under an Emergency Use Authorization (EUA).    What should I tell my healthcare provider before I receive EVUSHELD?  Tell your healthcare provider if you:  ?? Have any allergies  ?? Have low numbers of blood platelets (which help blood clotting), a bleeding disorder, or are taking anticoagulants (to prevent blood clots)  ?? Have had a heart attack or stroke, have other heart problems, or are at high-risk of cardiac (heart) events  ?? Are pregnant or plan to become pregnant  ?? Are breastfeeding a child  ?? Have any serious illness  ?? Are taking any medications (prescription, over-the-counter, vitamins, or herbal products)     How will I receive EVUSHELD?  ?? EVUSHELD consists of two investigational medicines, tixagevimab and cilgavimab.  ?? You will receive 1 dose of EVUSHELD, consisting of 2 separate injections (tixagevimab and cilgavimab).  ?? EVUSHELD will be given to you by your healthcare provider as 2 intramuscular injections. They are usually, given one after the other, 1 into each of your buttocks.    After the initial dose, if your healthcare provider determines that you need to receive additional doses of EVUSHELD for ongoing protection, the additional doses would  be administered once every 6 months.    Who should generally not take EVUSHELD?  Do not take EVUSHELD if you have had a severe allergic reaction to EVUSHELD or any ingredient in EVUSHELD.    What are the important possible side effects of EVUSHELD?  Possible side effects of EVUSHELD are:  ?? Allergic reactions. Allergic reactions can happen during and after injection of EVUSHELD. Tell your healthcare provider right away if you get any of the following signs and symptoms of allergic reactions: fever, chills, nausea, headache, shortness of breath, low or high blood pressure, rapid or slow heart rate, chest discomfort or pain, weakness, confusion, feeling tired, wheezing, swelling of your lips, face, or throat, rash including hives, itching, muscle aches, dizziness and sweating. These reactions may be severe or life threatening.  ?? Cardiac (heart) events: Serious cardiac adverse events have happened, but were not common, in people who received EVUSHELD and also in people who did not receive EVUSHELD in the clinical trial studying pre-exposure prophylaxis for prevention of COVID-19. In people with risk factors for cardiac events (including a history of heart attack), more people who received EVUSHELD experienced serious cardiac events than people who did not receive EVUSHELD. It is not known if these events are related to [facility] or underlying medical conditions. Contact your healthcare provider or get medical help right away if you get any symptoms of cardiac events, including pain, pressure, or discomfort in the chest, arms, neck, back, stomach or jaw, as well as shortness of breath, feeling tired or weak (fatigue), feeling sick (nausea), or swelling in your ankles or lower legs.    The side effects of getting any medicine by intramuscular injection may include pain, bruising of the skin, soreness, swelling, and possible bleeding or infection at the injection site.    These are not all the possible side effects of EVUSHELD. Not a lot of people have been given EVUSHELD. Serious and unexpected side effects may happen. EVUSHELD is still being studied so it is possible that all of the risks are not known at this time.    It is possible that EVUSHELD may reduce your body's immune response to a COVID-19 vaccine. If you have received a COVID-19 vaccine, you should wait to receive EVUSHELD until at least 2 weeks after COVID-19 vaccination.    What other prevention choices are there?  Vaccines to prevent COVID-19 are approved or available under Emergency Use Authorization. Use of EVUSHELD does not replace vaccination against COVID-19. For more information about other medicines authorized for treatment or prevention of COVID-19 go to [email or web address] for more information.    It is your choice to receive or not receive EVUSHELD. Should you decide not to receive EVUSHELD, it will not change your standard medical care.    EVUSHELD is not authorized for post-exposure prophylaxis of COVID-19.    What if I am pregnant or breastfeeding?  If you are pregnant or breastfeeding, discuss your options and specific situation with your healthcare provider.    How do I report side effects with EVUSHELD?  Contact your healthcare provider if you have any side effects that bother you or do not go away. Report side effects to FDA MedWatch at [facility] or call 1-800-FDA-1088 or call [employer] at [phone].    Additional Information  If you have questions, visit the website or call the telephone number provided below.    Website  Telephone number    [email or web address]  [phone]      How can I learn  more about COVID-19?  ?? Ask your healthcare provider.  ?? Visit: [email or web address]    ?? Contact your local or state public health department.    What is an Emergency Use Authorization?  The [place] FDA has made EVUSHELD (tixagevimab co-packaged with cilgavimab) available under an emergency access mechanism called an Emergency Use Authorization EUA. The EUA is supported by a Surveyor, minerals and Human Service (HHS) declaration that circumstances exist to justify the emergency use of drugs and biological products during the COVID-19 pandemic.    EVUSHELD for pre-exposure prophylaxis for prevention of coronavirus disease 2019 (COVID-19) caused by the SARS-CoV-2 virus has not undergone the same type of review as an FDA-approved product. In issuing an EUA under the COVID-19 public health emergency, the FDA has determined, among other things, that based on the total amount of scientific evidence available including data from adequate and well-controlled clinical trials, if available, it is reasonable to believe that the product may be effective for diagnosing, treating, or preventing COVID-19, or a serious or life-threatening disease or condition caused by COVID-19; that the known and potential benefits of the product, when used to diagnose, treat, or prevent such disease or condition, outweigh the known and potential risks of such product; and that there are no adequate, approved and available alternatives.    All of these criteria must be met to allow for the product to be used in the treatment of patients during the COVID-19 pandemic. The EUA for EVUSHELD is in effect for the duration of the COVID-19 declaration justifying emergency use of EVUSHELD,  unless terminated or revoked (after which EVUSHELD may no longer be used under the EUA).    Distributed by: [employer], [place], [place] [address]    Manufactured by: [employer], [address], [place], [place] [address], [place]    ??[employer] 2021. All rights reserved.     Patient Education   Frequently Asked Questions on the Emergency Use Authorization for Evusheld (tixagevimab co-packaged with cilgavimab) for Pre-exposure Prophylaxis (PrEP) of COVID-19     Q. What is an Emergency Use Authorization (EUA)?   A: Under section 564 of the [employer], Drug & Cosmetic Act, after a declaration by the [facility] Secretary based on one of four types of determinations, FDA may authorize an unapproved product or unapproved uses of an approved product for emergency use. In issuing an EUA, FDA must determine, among other things, that based on the totality of scientific evidence available to the Agency, including data from adequate and well-controlled clinical trials, if available, it is reasonable to believe that the product may be effective in diagnosing, treating, or preventing a serious or life-threatening disease or condition caused by a chemical, biological, radiological, or nuclear agent; that the known and potential benefits, when used to treat, diagnose or prevent such disease or condition, outweigh the known and potential risks for the product; and that there are no adequate, approved, and available alternatives. Emergency use authorization is NOT the same as FDA approval or licensure.     Q. What does this EUA authorize? What are the limitations of authorized use?   A: The EUA authorizes [employer]???s Evusheld (tixagevimab co-packaged with cilgavimab) for emergency use as pre-exposure prophylaxis for prevention of COVID-19 in adults and pediatric individuals (43 years of age and older weighing at least 40 kg):     ??? Who are not currently infected with SARS-CoV-2 and who have not had a known recent exposure to an individual infected with SARS-CoV-2  and  o Who have moderate to severe immune compromise due to a medical condition or receipt of immunosuppressive medications or treatments and may not mount an adequate immune response to COVID-19 vaccination or  o For whom vaccination with any available COVID-19 vaccine, according to the approved or authorized schedule, is not recommended due to a history of severe adverse reaction (e.g., severe allergic reaction) to a COVID-19 vaccine(s) and/or COVID-19 vaccine component(s).     Limitations of Authorized Use     ??? Evusheld is not authorized for use in individuals:   o For treatment of COVID-19, or   o For post-exposure prophylaxis of COVID-19 in individuals who have been exposed to someone infected with SARS-CoV-2.   ??? Pre-exposure prophylaxis with Evusheld is not a substitute for vaccination in individuals for whom COVID-19 vaccination is recommended. Individuals for whom COVID-19 vaccination is recommended, including individuals with moderate to severe immune compromise who may derive benefit from COVID-19 vaccination, should receive COVID-19 vaccination.   ??? In individuals who have received a COVID-19 vaccine, Evusheld should be administered at least two weeks after vaccination.     Q. What are some medical conditions or treatments that may lead to an inadequate immune response to the COVID-19 vaccination?   A: Medical conditions or treatments that may result in moderate to severe immunocompromise and an inadequate immune response to COVID-19 vaccination include but are not limited to:   ??? Active treatment for solid tumor and hematologic malignancies   ??? Receipt of solid-organ transplant and taking immunosuppressive therapy   ??? Receipt of chimeric antigen receptor (CAR)-T-cell or hematopoietic stem cell transplant (within 2 years of transplantation or taking immunosuppression therapy)   ??? Moderate or severe primary immunodeficiency (e.g., DiGeorge syndrome, Wiskott-Aldrich syndrome)   ??? Advanced or untreated HIV infection (people with HIV and CD4 cell counts)  ??? Active treatment with high-dose corticosteroids (i.e., ?20 mg prednisone or equivalent per day when administered for ?2 weeks), alkylating agents, antimetabolites, transplant-related immunosuppressive drugs, cancer chemotherapeutic agents classified as severely immunosuppressive, tumor-necrosis (TNF) blockers, and other biologic agents that are immunosuppressive or immunomodulatory (e.g., B-cell depleting agents)     For additional information, refer to the [facility] Vaccines & Immunizations website.     Q. Is Evusheld approved by the FDA to prevent or treat COVID-19?   A. No. Evusheld is not FDA-approved to prevent or treat any diseases or conditions, including COVID-19. Evusheld is an investigational drug.     Q. How can Evusheld be obtained for use under the EUA?   A. For questions on how to obtain Evusheld, please contact COVID19therapeutics@hhs .gov.     Q. Who may prescribe Evusheld under the EUA?   A. Evusheld may only be prescribed for an individual patient by physicians, advanced practice registered nurses, and physician assistants that are licensed or authorized under state law to prescribe drugs in the therapeutic class to which Evusheld belongs (i.e., anti-infectives).     Q. Are tixagevimab and cilgavimab monoclonal antibodies? What is a monoclonal antibody?   A. Yes, tixagevimab and cilgavimab are monoclonal antibodies. Monoclonal antibodies are laboratory-produced molecules engineered to serve as substitute antibodies that can restore, enhance or mimic the immune system's attack on pathogens. Evusheld is designed to block viral attachment and entry into human cells, thus neutralizing the virus.     Q. When should Evusheld be administered to a patient?   A. Patients should talk to their health care provider to determine whether, based on their individual circumstances, they are  eligible to receive Evusheld, and when it should be administered. More information about administration is available in the Fact Sheet for Health Care Providers.     Q. Are there potential side effects of Evusheld?   A. Possible side effects of Evusheld include the following:     Allergic reactions can happen during and after injection of Evusheld. Reactions to Evusheld may include difficulty breathing or swallowing; shortness of breath; wheezing; swelling of the face, lips, tongue or throat; rash including hives; or itching.     The side effects of getting any medicine by intramuscular injection may include pain, bruising of the skin, soreness, swelling, and possible bleeding or infection at the injection site.     Serious cardiac adverse events (such as myocardial infarction and heart failure) were infrequent in the clinical trial evaluating Evusheld for pre-exposure prophylaxis for prevention. However, more trial participants had serious cardiac adverse events after receiving Evusheld compared to placebo. These participants all had risk factors for cardiac disease or a history of cardiovascular disease before participating in the clinical trial. It is not clear if Evusheld caused these cardiac adverse events.     These are not all the possible side effects of Evusheld. Not a lot of people have been given Evusheld. Serious and unexpected side effects may happen. Evusheld is still being studied so it is possible that all of the risks are not known at this time.     Q. Are there reporting requirements for health care facilities and providers as part of the EUA?   A. Yes. As part of the EUA, FDA requires health care providers who prescribe Evusheld to report all medication errors and serious adverse events considered to be potentially related to Evusheld through FDA???s MedWatch Adverse Event Reporting program.     Health care facilities and providers must report therapeutics information and utilization data as directed by the U.S. Department of Health and [employer].     Q. Do patient outcomes need to be reported under the EUA?   A. No, reporting of patient outcomes is not required under the EUA. However, reporting of all medication errors and serious adverse events considered to be potentially related to Evusheld occurring during treatment is required.     Q. FDA has issued a number of EUAs including for therapeutics. If state laws impose different or additional requirements on the medical product covered by an EUA, are those state laws preempted?   A. As stated in FDA???s Emergency Use Authorization of Medical Products and Related Authorities Guidance, ???FDA believes that the terms and conditions of an EUA issued under section 564 preempt state or local law, both legislative requirements and common-law duties, that impose different or additional requirements on the medical product for which the EUA was issued in the context of the emergency declared under section 564.??? The guidance explains the basis for FDA???s views on this subject.     Q. Can health care providers share the patient/caregiver Fact Sheet electronically?   A. The letter of authorization for Evusheld, requires that Fact Sheets be made available to health care providers and to patients/caregivers ???through appropriate means.??? Electronic delivery of the patient/caregiver Fact Sheet is an appropriate means. For example, when the patient requests the Fact       Sheet electronically, it can be delivered as a PDF prior to medication administration. Health care providers should confirm receipt of the Fact Sheet with the patient.     Q. Can I receive a COVID-19 vaccine  if I was treated with a monoclonal antibody for COVID-19?   A. Patients and health care providers should refer to recommendations of the Advisory Committee on Immunization Practices regarding vaccination.     Q. Can I receive Evusheld if I recently received a COVID-19 vaccine? A. Evusheld may reduce your body???s immune response to a COVID-19 vaccine. If you receive a COVID-19 vaccine, you should wait to receive Evusheld until at least two weeks after your COVID-19 vaccination.     Q. Are there data showing Evusheld may provide benefit for pre-exposure prophylaxis for prevention of COVID-19 in certain patients?   A. Yes. The most important scientific evidence supporting the authorization of Evusheld is from [employer], a randomized, double-blind, placebo-controlled clinical trial in adults who had not received a COVID-19 vaccine and did not have a history of SARS-CoV-2 infection or test positive for SARS-CoV-2 infection at the start of the trial. All trial participants were either ?80 years of age, had a pre-specified co-morbidity (obesity, congestive heart failure, chronic obstructive pulmonary disease, chronic kidney disease, chronic liver disease, immunocompromised state, or previous history of severe or serious adverse event after receiving any approved vaccine), or were at increased risk of SARS-CoV-2 infection due to their living situation or occupation.     The main outcome measured in the trial was whether the trial participant had a case of documented COVID-19 after receiving Evusheld or placebo and before day 183 of the trial. In this trial, 3,441 people received Evusheld and 1,731 received a placebo. In the primary analysis, Evusheld recipients saw a 77% reduced risk of developing COVID-19 compared to those who received a placebo, a statistically significant difference. In additional analyses, the reduction in risk of developing COVID-19 was maintained for Evusheld recipients through six months. The safety and effectiveness of this investigational therapy for use in the pre-exposure prevention of COVID-19 continue to be evaluated.        Patient Education        Iliotibial Band Syndrome: Care Instructions  Your Care Instructions  Iliotibial band syndrome is pain and swelling of the iliotibial band (also called the IT band). This is a band of tissue that runs down the outside of your thigh. It connects the side of your hip to the side of your knee. It helps keep your knee and hip stable and in their normal position.  When you have IT band syndrome, you may feel pain on the outside of your hip. It happens as your IT band snaps back and forth over the bony point of your hip. Sometimes you may only feel pain on the outside of your knee.  You can get this syndrome if the IT band is too tight or if you do certain activities over and over that put pressure on your hip or knee. This is a common problem in runners, cyclists, and people who do other aerobic activities.  IT band syndrome is treated with rest and medicines. These relieve swelling and pain. Physical therapy is also used. It may include stretching or doing certain exercises that can help strengthen your IT band and hip muscles. Sometimes a steroid shot is given to help relieve pain at the spot that is most sore.  Follow-up care is a key part of your treatment and safety. Be sure to make and go to all appointments, and call your doctor if you are having problems. It's also a good idea to know your test results and keep a list of the medicines you take.  How can you care for yourself at home?  ?? Stay at a healthy weight. Being overweight puts extra strain on your hip and knee joints.  ?? Take pain medicines exactly as directed.  ? If the doctor gave you a prescription medicine for pain, take it as prescribed.  ? If you are not taking a prescription pain medicine, ask your doctor if you can take an over-the-counter medicine.  ?? Talk to your doctor or physical therapist about exercises that will help ease hip and knee pain.  ? Stretch before you exercise. This can help prevent stiffness and injury. You can try gentle forms of yoga to help keep your joints and muscles flexible.  ? Use exercises that are less stressful on the joints. Walk instead of jog. Ride a stationary bike with little resistance. Or you can swim or try water exercise.  ? Do exercises that can help strengthen your IT band and hip muscles. Your doctor or physical therapist can tell you what kind of exercises are best for you. He or she can help you learn the right way to do the exercises.  When should you call for help?  Watch closely for changes in your health, and be sure to contact your doctor if:  ?? ?? You have pain in your hip or knee that doesn't go away.   ?? ?? You do not get better as expected.   Where can you learn more?  Go to [facility] at [email or web address] in the Menu. Enter L449 in the search box to learn more about Iliotibial Band Syndrome: Care Instructions.  Current as of: February 29, 2020??????????????????????????????Content Version: 13.2  ?? 2006-2022 [employer].   Care instructions adapted under license by [facility]. If you have questions about a medical condition or this instruction, always ask your healthcare professional. [employer], Inc[employer] disclaims any warranty or liability for your use of this information.         Patient Education        Iliotibial Band Syndrome: Exercises  Introduction  Here are some examples of exercises for you to try. The exercises may be suggested for a condition or for rehabilitation. Start each exercise slowly. Ease off the exercises if you start to have pain.  You will be told when to start these exercises and which ones will work best for you.  How to do the exercises  Iliotibial band stretch    1. Lean sideways against a wall. If you are not steady on your feet, hold on to a chair or counter.  2. Stand on the leg with the affected hip, with that leg close to the wall. Then cross your other leg in front of it.  3. Let your affected hip drop out to the side of your body and against the wall. Then lean away from your affected hip until you feel a stretch.  4. Hold the stretch for 15 to 30 seconds.  5. Repeat 2 to 4 times.  Piriformis stretch    1. Lie on your back with your legs straight.  2. Lift your affected leg and bend your knee. With your opposite hand, reach across your body, and then gently pull your knee toward your opposite shoulder.  3. Hold the stretch for 15 to 30 seconds.  4. Repeat 2 to 4 times.  Hamstring wall stretch    1. Lie on your back in a doorway, with your good leg through the open door.  2. Slide  your affected leg up the wall to straighten your knee. You should feel a gentle stretch down the back of your leg.  3. Hold the stretch for at least 1 minute to begin. Then try to lengthen the time you hold the stretch to as long as 6 minutes.  4. Repeat 2 to 4 times.  5. If you do not have a place to do this exercise in a doorway, there is another way to do it:  6. Lie on your back, and bend the knee of your affected leg.  7. Loop a towel under the ball and toes of that foot, and hold the ends of the towel in your hands.  8. Straighten your knee, and slowly pull back on the towel. You should feel a gentle stretch down the back of your leg.  9. Hold the stretch for 15 to 30 seconds. Or even better, hold the stretch for 1 minute if you can.  10. Repeat 2 to 4 times.  1. Do not arch your back.  2. Do not bend either knee.  3. Keep one heel touching the floor and the other heel touching the wall. Do not point your toes.  Follow-up care is a key part of your treatment and safety. Be sure to make and go to all appointments, and call your doctor if you are having problems. It's also a good idea to know your test results and keep a list of the medicines you take.  Where can you learn more?  Go to [facility] at [email or web address] in the Menu. Enter P252 in the search box to learn more about Iliotibial Band Syndrome: Exercises.  Current as of: February 29, 2020??????????????????????????????Content Version: 13.2  ?? 2006-2022 Healthwise, Inc[employer].   Care instructions adapted under license by [facility]. If you have questions about a medical condition or this instruction, always ask your healthcare professional. [employer], [employer] disclaims any warranty or liability for your use of this information.

## 2020-11-29 LAB — VITAMIN D 25 HYDROXY: VITAMIN D, TOTAL (25OH): 50.8 ng/mL (ref 20.0–80.0)

## 2020-12-10 NOTE — Unmapped (Signed)
Hi,    I reached out to the patient and sent a financial assistance application to her. I gave her Toryan's number as well as he may know other options for her.    Thank you,  Toni Amend

## 2021-01-03 ENCOUNTER — Ambulatory Visit: Admit: 2021-01-03 | Discharge: 2021-01-04 | Payer: MEDICARE

## 2021-01-03 DIAGNOSIS — L910 Hypertrophic scar: Principal | ICD-10-CM

## 2021-01-03 MED ORDER — CLOBETASOL 0.05 % TOPICAL OINTMENT
Freq: Two times a day (BID) | TOPICAL | 1 refills | 0.00000 days | Status: CP
Start: 2021-01-03 — End: 2022-01-03

## 2021-01-03 NOTE — Unmapped (Addendum)
-   Start taking over the counter nicotinamide (niacinamide) 500 mg tablets twice daily.     We froze the precancerous spots with cryotherapy. Please see post procedure care details below.    Cryosurgery  Cryosurgery (???freezing???) uses liquid nitrogen to destroy certain types of skin lesions. Lowering the temperature of the lesion in a small area surrounding skin destroys the lesion. Immediately following cryosurgery, you will notice redness and swelling of the treatment area. Blistering or weeping may occur, lasting approximately one week which will then be followed by crusting. Most areas will heal completely in 10 to 14 days.    Wash the treated areas daily. Allow soap and water to run over the areas, but do not scrub. Should a scab or crust form, allow it to fall off on its own. Do not remove or pick at it. Application of an ointment  and a bandage may make you feel more comfortable, but it is not necessary. Some people develop an allergy to Neosporin, so we recommend that Vaseline or  Aquaphor be used.    The cryotherapy site will be more sensitive than your surrounding skin. Keep it covered, and remember to apply sunscreen every day to all your sun exposed skin. A scar may remain which is lighter or pinker than your normal skin. Your body will continue to improve your scar for up to one year; however a light-colored scar may remain.    Infection following cryotherapy is rare. However if you are worried about the appearance of the treated area, contact your doctor. We have a physician on call at all times. If you have any concerns about the site, please call our clinic at [phone]

## 2021-01-03 NOTE — Unmapped (Signed)
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  - Diffuse actinic damage  - May use topical 5FU as spot treatment after she returns from the beach, but hold off on field treatment until the fall.  - Jointly elected to proceed with cryotherapy today. See procedure notes below.  - Start OTC nicotinamide 500 mg tablets twice daily.  - Several lesions on the legs are suspicious for NMSC. Patient defers biopsy today but will consider at next visit.     Cryotherapy Procedure Note:   After R/B/A discussed (including scarring, pigment alteration, recurrence, or persistence of the lesion) and verbal consent was obtained, identified lesions were treated with liquid nitrogen x 1 ten second freeze-thaw cycle. The patient tolerated the procedure well and was instructed on post-procedure care.  Total AK's frozen: 23  Location and number: right shin (3), right dorsal foot (2), right knee (2), right thigh (5),  left shin (4), left calf (1), left knee (2), left thigh (1), left forearm (1), right forearm (1), right chest (1)    Seborrheic keratoses, bilateral lower legs  - Reviewed etiology, natural progression of condition and treatment options at length with patient  - No treatments today.    Contact dermatitis, bilateral ankles, 2/2 poison oak  - Continue clobetasol 0.05% ointment twice daily as needed. Refill sent today.    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  No evidence of recurrence at this time. ??????  - Counseled patient on the importance of regular self-monitoring as well as clinical skin examinations as scheduled.??    The patient was advised to call for an appointment should any new, changing ,or symptomatic lesions or a flare develop.     RTC: Return in about 3 months (around 04/05/2021) for Recheck on Monday resident clinic w/ Dr.Devera Englander. or sooner as needed _____________________________________________________________________________________    Referring Provider: Lorenso Quarry, NP     Chief Complaint     Follow up of actinic keratoses    HPI     Shelly Cooper is a pleasant 80 y.o. female, who presents as a returning patient (last seen by Dr.Shaena Parkerson on 11/01/2020) to [facility] for follow up of  actinic keratoses.      At last visit patient was to continue topical 5FU to actinic keratoses on the legs twice daily x3-4 weeks. Additionally, 11 actinic keratoses were treated with cryotherapy.    Today patient reports that she believes that her legs are doing better but there are still a few lesions that need continued treatment. She notes red splotches on her lower legs that are pruritic. Additionally she notes a lesion on her left hand and right chest.     The patient denies any other new/changing lesions or other issues of concern.     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    Physical Examination     General: Well-appearing female, in no acute distress, resting comfortably  Neuro: Alert and oriented, answers questions appropriately  Focal Skin Exam: Per patient request, Focal examination of chest, bilateral lower extremeties, bilateral arms  Skin examination was notable for the following:  Actinic Keratoses: Scaly erythematous macules on the sun exposed areas of bilateral lower legs, arms, and chest  Seborrheic Keratoses: Multiple stuck-on appearing  keratotic papules on the trunk and extremities. none irritated w redness, crusting, edema, and /or partial avulsion   - Erythematous papules on bilateral ankles    All areas not commented on are within normal limits or unremarkable.    Scribe's Attestation: Benjie Karvonen, MD obtained and performed the history, physical exam and medical decision making elements that were entered into the chart. Signed by Ward Chatters, Scribe, on Jan 03, 2021 at 3:18 PM.    ----------------------------------------------------------------------------------------------------------------------  Jan 06, 2021 6:30 PM. Documentation assistance provided by the Scribe. I was present during the time the encounter was recorded. The information recorded by the Scribe was done at my direction and has been reviewed and validated by me.  ----------------------------------------------------------------------------------------------------------------------

## 2021-01-10 DIAGNOSIS — Z1231 Encounter for screening mammogram for malignant neoplasm of breast: Principal | ICD-10-CM

## 2021-03-17 ENCOUNTER — Encounter: Admit: 2021-03-17 | Discharge: 2021-03-18 | Payer: MEDICARE

## 2021-03-17 DIAGNOSIS — Z22322 Carrier or suspected carrier of Methicillin resistant Staphylococcus aureus: Principal | ICD-10-CM

## 2021-03-17 DIAGNOSIS — D492 Neoplasm of unspecified behavior of bone, soft tissue, and skin: Principal | ICD-10-CM

## 2021-03-17 MED ORDER — DOXYCYCLINE HYCLATE 100 MG TABLET
ORAL_TABLET | Freq: Two times a day (BID) | ORAL | 0 refills | 30.00000 days | Status: CP
Start: 2021-03-17 — End: 2021-04-16

## 2021-03-17 NOTE — Unmapped (Signed)
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  The site was cleansed with alcohol and anesthetized with 2% lidocaine with epinephrine.  Biopsy was performed with a shave tool and hemostasis was obtained in the usual fashion with Aluminum chloride, pressure, and/or electrocautery.  Prior to the procedure, risks and benefits were discussed including but not limited to risk of scarring, recurrence of lesion, potential need for other therapy depending on diagnosis with verbal informed consent being obtained.  Area was dressed with petrolatum and bandage.  Wound care instructions given.  We will contact the patient with results when available.     Diffuse actinic damage:  - Discussed referral to Dr. Jaclyn Shaggy high risk skin cancer clinic and she is agreeable    Personal history of non-melanoma skin cancer   - No evidence of recurrence at this time.  - Discussed maintaining vigilance and counseled on sun protection as above.    The patient was advised to call for an appointment should any new, changing, or symptomatic lesions develop.     RTC: Return in about 2 months (around 05/18/2021) for Follow up with Dr. Archie Balboa high risk skin cancer or Dr. Cheral Marker. or sooner as needed   _________________________________________________________________      Chief Complaint     Chief Complaint   Patient presents with   ??? Lesion Of Concern     Lesion on left upper leg above knee, lesion right lower leg, lesion under both under arms.  Pt states some of the areas does drain fluids and very painful.  Lesion on top of groin area.        HPI     Shelly Cooper. Shelly Cooper is a 80 y.o. female who presents as a returning patient (last seen 01/03/2021) to [facility] for multiple lesions of concern.     She notes the following:  - Lesion on left upper leg above knee, growing rapidly and tender  - Lesion on right lower leg with some drainage  - Lesion under both arms, tender to palpation  - Lesion on top of groin - PCP did aerobic culture on  03/11/21 with results showing MRSA sensitive to tetracycline family; no treatment started yet    The patient denies any other new or changing lesions or areas of concern.     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     ROS: Other than symptoms mentioned in the HPI, no fevers, chills, or other skin complaints    Physical Examination     GENERAL: Well-appearing female in no acute distress,  resting comfortably.  NEURO: Alert and oriented, answers questions appropriately  PSYCH: Normal mood and affect  SKIN (Focal Skin Exam): Per patient request, examination of bilateral upper extremities, mons pubis, bilateral lower extremities was performed  Bilateral axillae with 6mm tender nodules  Mons with a tender erythematous papule  Right shin with a pustule  Left thigh with a 2cm dome shaped papule with keratotic core  Diffuse scaly erythematous keratotic papules on the bilateral legs and arms    All areas not commented on are within normal limits or unremarkable      (Approved Template 05/13/2020)

## 2021-03-26 NOTE — Unmapped (Signed)
Patient notified of results via mychart.  She elects for IL 5FU as this has worked well for her in the past.  She has an appointment scheduled for 04/21/21.    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

## 2021-03-31 NOTE — Unmapped (Signed)
Pt has been added to waitlist. Called pt as well but phone was silent both times I called.

## 2021-04-16 ENCOUNTER — Ambulatory Visit: Admit: 2021-04-16 | Discharge: 2021-04-16 | Payer: MEDICARE

## 2021-04-21 ENCOUNTER — Ambulatory Visit: Admit: 2021-04-21 | Discharge: 2021-04-22 | Payer: MEDICARE

## 2021-04-21 DIAGNOSIS — L858 Other specified epidermal thickening: Principal | ICD-10-CM

## 2021-04-21 DIAGNOSIS — Z85828 Personal history of other malignant neoplasm of skin: Principal | ICD-10-CM

## 2021-04-21 DIAGNOSIS — D492 Neoplasm of unspecified behavior of bone, soft tissue, and skin: Principal | ICD-10-CM

## 2021-04-21 NOTE — Unmapped (Signed)
Dermatology Note     Assessment and Plan:      Neoplasm(ia) of unspecified etiology:  - To help confirm diagnosis, biopsy/biopsies obtained today.   Biopsy (Shave) Procedure Note:   After R/B/A discussed (including scarring, pigment alteration, recurrence, or persistence of the lesion) and verbal consent was obtained, the area was marked, photographed, prepped with alcohol, and anesthetized with lidocaine 2% with epinephrine. Biopsy(ies) performed using a shave technique. Hemostasis was achieved with pressure, aluminum chloride, Monsel's, and/or electrocautery. Area was dressed with petrolatum and bandage. Wound care instructions were provided. We will contact the patient with results when available. Patient agrees to be notified of results by MyChart - even if needs further management.   A) Location: right thigh, DDx: BCC vs SCC vs AK vs KA vs SK  B) Location: right shin, DDx: BCC vs SCC vs AK vs KA vs SK  Immediately after the biopsy, the sites were treated using curettage and IL5FU as described below:    Curettage was performed x 3 passes. The base was injected with 5-Fluorouracil x 0.4 (lesion A) and 0.2 (lesion B) ml. Area was cleaned, dressed and post-procedure wound care instructions were reviewed.     Lot #: [id number]  Exp: 02/28/2022    Treatment Area (# of treatments by date) Tumor Size (mm)   (A) right thigh SCC well 18mm   (B) right shin SCC mod-well 12mm            PROCEDURE NOTE:  Electrodesiccation and Curettage for KA on left thigh    Time out taken: Patient Verification with name, medical record number, and date of birth performed.   Site and procedure verified with patient/physician agreement and clinical photograph.   Implantable device: no             Allergies to Anesthetics: no     Intolerance to Epinephrine: no          Anticoagulants: no      Personnel present during procedure/verification: Arn Medal, MD  Kathie Rhodes  Operating surgeon (s): Arn Medal, MD     The size and nature of the excision as well as typical scarring were discussed along with, among others, the risks of bleeding, infection, cutaneous dysesthesia, and lesion recurrence.  Verbal consent obtained.    PROCEDURE:  The site was cleansed and marked. After injection of lidocaine 1% with epinephrine, the bulk of the tumor mass was removed with a 4 mm curette.     Curettage revealed the actual tumor size to be 2.8 cm.     Further destruction of the entire base of the tumor and the edges was achieved through aggressive curettage and light electrodessication. This process was repeated for a total of 3 passes to the base and periphery.  No operative complications were experienced.     Hemostasis: Accomplished with electrodessication, aluminum chloride, and/or pressure.   Dressings: Site was dressed with petrolatum and bandage.   Condition: Patient tolerated procedure and anesthesia well.  Post-op Medications: None.     Instructions:  The expectations and timing of normal wound healing were discussed as was the chance of recurrence. Daily wound care and bandaging were reviewed in detail. The patient was given a handout reiterating instructions.     RTC: Return in about 2 months (around 06/21/2021).     _________________________________________________________________      Chief Complaint     Chief Complaint   Patient presents with   ??? Lesion Of Concern  Lesion above left knee. Pt coming in for IL 5FU treatment.        HPI     Shelly Cooper is a 80 y.o. female who presents as a returning patient (last seen 03/17/2021) to [facility] for treatment of KA on the left thigh in addition to two new lesions of concern on the right leg. one on the thigh an the other on the shin. They are painful and growing. Not previously treated. She has started niacinamide 500mg  BID.     The patient denies any other new or changing lesions or areas of concern.     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     ROS: Other than symptoms mentioned in the HPI, no fevers, chills, or other skin complaints    Physical Examination     GENERAL: Well-appearing female in no acute distress, resting comfortably.  NEURO: Alert and oriented, answers questions appropriately  SKIN (Focal Skin Exam): Per patient request, examination of legs was performed    Number of Biopsies: 2  Site (Biopsy A): Thigh, Right  Differential (Biopsy A): Neoplasia - Uncertain etiology  Neoplasia (Biopsy A): BCC vs. SCC vs. AK vs. KA vs. SK  Type of biopsy (Biopsy A): Shave  Size of Lesion (Biopsy A): 18mm  Description (Biopsy A): keratotic nodule  --------------------- DX: ______________________ Stefan Church / N - EDC, EXCIS, MOHS, TOPICAL, PDT, OBSERV  Site (Biopsy B): Shin, Right  Differential (Biopsy B): Neoplasia - Uncertain etiology  Neoplasia (Biopsy B): BCC vs. SCC vs. AK vs. KA vs. SK  Type of biopsy (Biopsy B): Shave  Size of Lesion (Biopsy B): 12mm  Description (Biopsy B): keratotic nodule                All areas not commented on are within normal limits or unremarkable      (Approved Template 05/13/2020)

## 2021-04-21 NOTE — Unmapped (Signed)
ED&C     ED&C or curettage is a procedure in which a cancerous or precancerous area of the skin is removed. The abnormal cells are more fragile than the healthy cells surrounding the area, thus they are easily removed with scraping with a sharp instrument. To stop any bleeding, the scraping may be followed by light burning. The combination of scraping and burning is called electrodesiccation and curettage or ED &C. For certain types of cancer or precancerous changes, the scraping and burning cycle may be repeated.     To care for the area: Leave the bandage in place until the morning after your procedure is performed. On a daily basis, carefully remove the bandage, then shower or wash as usual. Allow water to run over the site. Please do not scrub. Carefully dry the area, then apply ointment (some people develop an allergy to Neosporin, so we recommend Vaseline or Aquaphor). Cover the site with a fresh bandage. Should any bleeding occur, apply firm pressure for 15 minutes. The treated site will heal best if  a scab never forms (the wound heals by new skin cells traveling from the outside toward the middle-their journey is easier if no scab stands in their way).    Long-term care: the site will be more sensitive than your surrounding skin. Keep it covered, and remember to apply sunscreen every day to all your exposed skin. A scar may remain which is lighter or pinker than your normal skin. Your body will continue to improve your scar for up to one year.    Infection following this procedure is rare. However, if you are worried about the appearance of your site, contact your doctor. Complete healing of an ED &C site may take up to one month or 6 weeks. We have a physician on call at all times. If you have any concerns about the site, please call our clinic at [phone]

## 2021-05-10 ENCOUNTER — Emergency Department
Admit: 2021-05-10 | Discharge: 2021-05-11 | Disposition: A | Discharge status: Home / Self Care | Payer: MEDICARE | Attending: Emergency Medicine

## 2021-05-10 ENCOUNTER — Ambulatory Visit
Admit: 2021-05-10 | Discharge: 2021-05-11 | Disposition: A | Discharge status: Home / Self Care | Payer: MEDICARE | Attending: Emergency Medicine

## 2021-05-10 LAB — COMPREHENSIVE METABOLIC PANEL
ALBUMIN: 4.2 g/dL (ref 3.4–5.0)
ALKALINE PHOSPHATASE: 130 U/L — ABNORMAL HIGH (ref 46–116)
ALT (SGPT): 11 U/L (ref 10–49)
ANION GAP: 8 mmol/L (ref 5–14)
AST (SGOT): 21 U/L (ref <=34)
BILIRUBIN TOTAL: 0.3 mg/dL (ref 0.3–1.2)
BLOOD UREA NITROGEN: 17 mg/dL (ref 9–23)
BUN / CREAT RATIO: 18
CALCIUM: 9.6 mg/dL (ref 8.7–10.4)
CHLORIDE: 109 mmol/L — ABNORMAL HIGH (ref 98–107)
CO2: 25.1 mmol/L (ref 20.0–31.0)
CREATININE: 0.95 mg/dL — ABNORMAL HIGH
EGFR CKD-EPI (2021) FEMALE: 61 mL/min/{1.73_m2} (ref >=60)
GLUCOSE RANDOM: 87 mg/dL (ref 70–179)
POTASSIUM: 3.8 mmol/L (ref 3.4–4.8)
PROTEIN TOTAL: 7.2 g/dL (ref 5.7–8.2)
SODIUM: 142 mmol/L (ref 135–145)

## 2021-05-10 LAB — CBC W/ AUTO DIFF
BASOPHILS ABSOLUTE COUNT: 0.1 10*9/L (ref 0.0–0.1)
BASOPHILS RELATIVE PERCENT: 0.8 %
EOSINOPHILS ABSOLUTE COUNT: 0.2 10*9/L (ref 0.0–0.5)
EOSINOPHILS RELATIVE PERCENT: 2.7 %
HEMATOCRIT: 43.9 % (ref 34.0–44.0)
HEMOGLOBIN: 14.7 g/dL (ref 11.3–14.9)
LYMPHOCYTES ABSOLUTE COUNT: 2.5 10*9/L (ref 1.1–3.6)
LYMPHOCYTES RELATIVE PERCENT: 29.8 %
MEAN CORPUSCULAR HEMOGLOBIN CONC: 33.5 g/dL (ref 32.0–36.0)
MEAN CORPUSCULAR HEMOGLOBIN: 30.4 pg (ref 25.9–32.4)
MEAN CORPUSCULAR VOLUME: 90.7 fL (ref 77.6–95.7)
MEAN PLATELET VOLUME: 7.4 fL (ref 6.8–10.7)
MONOCYTES ABSOLUTE COUNT: 0.8 10*9/L (ref 0.3–0.8)
MONOCYTES RELATIVE PERCENT: 9.3 %
NEUTROPHILS ABSOLUTE COUNT: 4.8 10*9/L (ref 1.8–7.8)
NEUTROPHILS RELATIVE PERCENT: 57.4 %
NUCLEATED RED BLOOD CELLS: 0 /100{WBCs} (ref <=4)
PLATELET COUNT: 274 10*9/L (ref 150–450)
RED BLOOD CELL COUNT: 4.84 10*12/L (ref 3.95–5.13)
RED CELL DISTRIBUTION WIDTH: 13.9 % (ref 12.2–15.2)
WBC ADJUSTED: 8.4 10*9/L (ref 3.6–11.2)

## 2021-05-10 LAB — HIGH SENSITIVITY TROPONIN I - SINGLE: HIGH SENSITIVITY TROPONIN I: 27 ng/L (ref <=34)

## 2021-05-11 LAB — HIGH SENSITIVITY TROPONIN I - SINGLE: HIGH SENSITIVITY TROPONIN I: 26 ng/L (ref <=34)

## 2021-05-11 NOTE — Unmapped (Signed)
[facility] Emergency Department Provider Note      ED Course, Assessment, and Plan     ED Course as of 05/11/21 0551   Sat May 10, 2021   2357 Impression: 80 year old female who presented to the ED for headache and hypertension. Vitals significant for hypertension but otherwise unremarkable. On exam patient is well appearing in no distress, lungs clear bilaterally with normal heart sounds and normal work of breathing, neurologically intact, no peripheral edema, mentating appropriately, abdomen soft and nontender.    Overall presentation concerning for hypertensive emergency, will obtain labs and imaging with EKG to ensure no endorgan dysfunction.  Patient's headache is currently resolved and she denies any associated chest pain or shortness of breath making ACS or pulmonary edema less likely. No confusion or neurologic deficits to suggest intracranial hemorrhage. If workup is reassuring will provide medication for blood pressure control and have her followup with her PCP for ongoing BP management.   Shelly Cooper May 11, 2021   0272 Work-up overall reassuring without any significant lab abnormalities or imaging findings.  EKG nonischemic.  Plan for discharge.     _____________________________________________________________________    The case was discussed with the attending physician who is in agreement with the above assessment and plan.    Additional Medical Decision Making     The patient's vital signs, EKG tracing, and pertinent labs and imaging results that were available during my care of the patient have been independently reviewed by me and considered in my medical decision making. I have reviewed the patient's prior medical records where available.    History     Chief Complaint:   Chief Complaint   Patient presents with   ??? Hypertension       History of Present Illness:  Shelly Cooper is a 80 y.o. female with a past medical history of HTN,??NSTEMI s/p PCI (1999, 2001),??rheumatoid arthritis, CKD, and GERD who presents to the ED for evaluation of hypertension. The patient reports that her BP was 207/106 on her home monitor yesterday morning, at which time she developed a slight headache. Her BP remained high during re-checks since then. Currently, her headache is mostly resolved. She is medicated on Losartan and endorses compliance with her usual daily dosage. She states that her usual BP is approximately 120/80, and reports that she has never had hypertension to this degree while taking Losartan. She has not drank coffee yesterday or today. Denies tobacco use. No recreational drug use. Denies chest pain, SOB, leg swelling, abdominal pain, nausea, vomiting, diarrhea, weakness, or numbness.     Past Medical History:  Past Medical History:   Diagnosis Date   ??? Actinic keratosis    ??? Allergic    ??? Arthritis    ??? Basal cell carcinoma    ??? Chronic kidney disease    ??? Eczema ?   ??? GERD (gastroesophageal reflux disease)    ??? Heart disease    ??? Hypertension    ??? Myocardial infarction (CMS-HCC) 1999, 2001    Stents, 2001   ??? Rheumatoid arthritis(714.0)    ??? Squamous cell skin cancer    ??? Trigger finger        Medications:   No current facility-administered medications for this encounter.    Current Outpatient Medications:   ???  abatacept (ORENCIA CLICKJECT) 125 mg/mL AtIn, Inject the contents of 1 pen (125 mg) under the skin once a week., Disp: 12 mL, Rfl: 3  ???  amLODIPine (NORVASC) 5 MG tablet, Take  1 tablet (5 mg total) by mouth daily., Disp: 90 tablet, Rfl: 3  ???  clobetasoL (TEMOVATE) 0.05 % ointment, Apply topically Two (2) times a day., Disp: 60 g, Rfl: 1  ???  clopidogrel (PLAVIX) 75 mg tablet, Take 75 mg by mouth daily., Disp: , Rfl:   ???  fluorouraciL (EFUDEX) 5 % cream, Apply topically Two (2) times a day., Disp: 40 g, Rfl: 2  ???  ipratropium (ATROVENT) 21 mcg (0.03 %) nasal spray, , Disp: , Rfl:   ???  lansoprazole (PREVACID) 30 MG capsule, Take 1 capsule by mouth once daily., Disp: , Rfl:   ???  losartan (COZAAR) 100 MG tablet, Take 100 mg by mouth daily. , Disp: , Rfl:   ???  torsemide (DEMADEX) 20 MG tablet, Take 20 mg by mouth daily as needed. , Disp: , Rfl:   Patient's Medications   New Prescriptions    No medications on file   Previous Medications    ABATACEPT (ORENCIA CLICKJECT) 125 MG/ML ATIN    Inject the contents of 1 pen (125 mg) under the skin once a week.    AMLODIPINE (NORVASC) 5 MG TABLET    Take 1 tablet (5 mg total) by mouth daily.    CLOBETASOL (TEMOVATE) 0.05 % OINTMENT    Apply topically Two (2) times a day.    CLOPIDOGREL (PLAVIX) 75 MG TABLET    Take 75 mg by mouth daily.    FLUOROURACIL (EFUDEX) 5 % CREAM    Apply topically Two (2) times a day.    IPRATROPIUM (ATROVENT) 21 MCG (0.03 %) NASAL SPRAY        LANSOPRAZOLE (PREVACID) 30 MG CAPSULE    Take 1 capsule by mouth once daily.    LOSARTAN (COZAAR) 100 MG TABLET    Take 100 mg by mouth daily.     TORSEMIDE (DEMADEX) 20 MG TABLET    Take 20 mg by mouth daily as needed.    Modified Medications    No medications on file   Discontinued Medications    No medications on file       Allergies:   Aspirin and Lyrica  [pregabalin]    Past Surgical History:   Past Surgical History:   Procedure Laterality Date   ??? HYSTERECTOMY  1978    fibroids   ??? NASAL POLYP EXCISION     ??? SKIN BIOPSY     ??? [facility] HISTORICAL RESULT)  2001    post-MI   ??? TRIGGER FINGER RELEASE         Social History:   Social History     Tobacco Use   ??? Smoking status: Former Smoker     Packs/day: 1.00     Years: 3.00     Pack years: 3.00     Types: Cigarettes     Start date: 01/29/1969     Quit date: 03/05/1972     Years since quitting: 49.2   ??? Smokeless tobacco: Never Used   Substance Use Topics   ??? Alcohol use: Never     Alcohol/week: 0.0 standard drinks       Family History:  Family History   Problem Relation Age of Onset   ??? Cancer Mother 21        pancreatic cancer   ??? Hyperlipidemia Father    ??? Heart disease Father    ??? Basal cell carcinoma Sister    ??? Squamous cell carcinoma Sister    ??? No Known Problems Daughter    ???  Diabetes Maternal Grandmother    ??? No Known Problems Maternal Grandfather    ??? No Known Problems Paternal Grandmother    ??? No Known Problems Paternal Grandfather    ??? Diabetes Maternal Uncle    ??? No Known Problems Other    ??? Melanoma Neg Hx    ??? BRCA 1/2 Neg Hx    ??? Breast cancer Neg Hx    ??? Colon cancer Neg Hx    ??? Endometrial cancer Neg Hx    ??? Ovarian cancer Neg Hx         Review of Systems:  10-point ROS obtained and otherwise negative except as noted in HPI.      Physical Exam     Vital Signs:    BP 192/88  - Pulse 70  - Temp 36.9 ??C (98.4 ??F) (Oral)  - Resp 16  - SpO2 97%     General: Alert and oriented, in no distress.  Skin: Skin is warm and dry.  HEENT: Normocephalic and atraumatic, moist mucous membranes, no nasal drainage, no intra-oral lesions or erythema.  Lungs: Normal respiratory effort. Breath sounds clear bilaterally.  Heart: Normal rate, regular rhythm.  Abdomen: Soft and non-distended, non-tender to palpation.  Extremities: No peripheral edema, no deformities or tenderness.  Neurological: Normal speech and language. No gross focal neurologic deficits are appreciated.  Psychiatric: Normal affect and behavior for situation.      Radiology     CT Head Wo Contrast   Preliminary Result   No acute intracranial abnormality.      XR Chest Portable   Preliminary Result      Obscuration of the inferior left and right heart borders, similar to 07/11/2020 and indeterminate. Findings could represent atelectasis versus infection versus body habitus.      Can consider further evaluation with two-view chest radiograph.          Labs     Labs Reviewed   COMPREHENSIVE METABOLIC PANEL - Abnormal; Notable for the following components:       Result Value    Chloride 109 (*)     Creatinine 0.95 (*)     Alkaline Phosphatase 130 (*)     All other components within normal limits   HIGH SENSITIVITY TROPONIN I - SINGLE - Normal   HIGH SENSITIVITY TROPONIN I - SINGLE - Normal   CBC W/ DIFFERENTIAL    Narrative:     The following orders were created for panel order CBC w/ Differential.  Procedure                               Abnormality         Status                     ---------                               -----------         ------                     CBC w/ Differential[[id number]]                             Final result  Please view results for these tests on the individual orders.   CBC W/ AUTO DIFF     _____________________________________________________________________    Please note - This documentation was generated using dictation and/or voice recognition software, and as such, may contain spelling or other transcription errors. Any questions regarding the content of this documentation should be directed to the individual who electronically signed.    Documentation assistance was provided by Everlene Balls, Scribe, on May 10, 2021 at 11:46 PM for Margo Aye, MD.     Documentation assistance was provided by the scribe in my presence.  The documentation recorded by the scribe has been reviewed by me and accurately reflects the services I personally performed.       Anders Grant, MD  Resident  05/11/21 [phone]

## 2021-05-11 NOTE — Unmapped (Signed)
Pt ambulatory to triage for HTN with associated headache, pt sts today BP (systolic 200s). Denies changes in Losartan rx. No report of cp or sob.

## 2021-05-15 ENCOUNTER — Encounter (INDEPENDENT_AMBULATORY_CARE_PROVIDER_SITE_OTHER): Payer: Medicare Other | Admitting: Ophthalmology

## 2021-05-15 ENCOUNTER — Other Ambulatory Visit: Payer: Self-pay

## 2021-05-15 DIAGNOSIS — H353132 Nonexudative age-related macular degeneration, bilateral, intermediate dry stage: Secondary | ICD-10-CM | POA: Diagnosis not present

## 2021-05-15 DIAGNOSIS — H43813 Vitreous degeneration, bilateral: Secondary | ICD-10-CM | POA: Diagnosis not present

## 2021-05-15 DIAGNOSIS — H35033 Hypertensive retinopathy, bilateral: Secondary | ICD-10-CM

## 2021-05-15 DIAGNOSIS — I10 Essential (primary) hypertension: Secondary | ICD-10-CM | POA: Diagnosis not present

## 2021-07-20 DIAGNOSIS — M0600 Rheumatoid arthritis without rheumatoid factor, unspecified site: Principal | ICD-10-CM

## 2021-07-20 MED ORDER — ORENCIA CLICKJECT 125 MG/ML SUBCUTANEOUS AUTO-INJECTOR
SUBCUTANEOUS | 3 refills | 84.00000 days | Status: CP
Start: 2021-07-20 — End: ?

## 2021-07-26 ENCOUNTER — Other Ambulatory Visit: Payer: Self-pay

## 2021-07-26 ENCOUNTER — Ambulatory Visit
Admission: EM | Admit: 2021-07-26 | Discharge: 2021-07-26 | Disposition: A | Discharge status: Home / Self Care | Payer: Medicare Other

## 2021-07-26 ENCOUNTER — Ambulatory Visit (INDEPENDENT_AMBULATORY_CARE_PROVIDER_SITE_OTHER): Payer: Medicare Other

## 2021-07-26 ENCOUNTER — Encounter: Payer: Self-pay | Admitting: Emergency Medicine

## 2021-07-26 DIAGNOSIS — M0600 Rheumatoid arthritis without rheumatoid factor, unspecified site: Principal | ICD-10-CM

## 2021-07-26 DIAGNOSIS — R0602 Shortness of breath: Secondary | ICD-10-CM

## 2021-07-26 DIAGNOSIS — R062 Wheezing: Secondary | ICD-10-CM

## 2021-07-26 DIAGNOSIS — R059 Cough, unspecified: Secondary | ICD-10-CM | POA: Diagnosis not present

## 2021-07-26 DIAGNOSIS — R051 Acute cough: Secondary | ICD-10-CM

## 2021-07-26 DIAGNOSIS — J209 Acute bronchitis, unspecified: Secondary | ICD-10-CM | POA: Diagnosis not present

## 2021-07-26 DIAGNOSIS — I10 Essential (primary) hypertension: Secondary | ICD-10-CM

## 2021-07-26 MED ORDER — ALBUTEROL SULFATE HFA 108 (90 BASE) MCG/ACT IN AERS: 1.0000 | INHALATION_SPRAY | Freq: Four times a day (QID) | RESPIRATORY_TRACT | 0 refills | Status: DC | PRN

## 2021-07-26 MED ORDER — PREDNISONE 20 MG PO TABS: 40.0000 mg | ORAL_TABLET | Freq: Every day | ORAL | 0 refills | Status: AC

## 2021-07-26 MED ORDER — BENZONATATE 200 MG PO CAPS: 200.0000 mg | ORAL_CAPSULE | Freq: Three times a day (TID) | ORAL | 0 refills | Status: AC | PRN

## 2021-07-26 NOTE — ED Provider Notes (Signed)
[facility]    CSN: [id number] Arrival date & time: 07/26/21  0809      History   Chief Complaint Chief Complaint  Patient presents with   Cough    HPI Sally Lee is a 80 y.o. female presenting for 67-month history of cough that is mostly dry but occasionally productive of clear sputum.  Reports getting into cough fits that leave her feeling short of breath.  She also reports that sometimes she gets short of breath when she walks but does not have to rest and "keeps ongoing."  She reports wheezing and shortness of breath over the past 2 weeks.  Patient did see her PCP about a month and a half ago and was placed on doxycycline for 10 days.  She says it did not help her cough at all and it persisted.  Patient contacted her PCP back to get another antibiotic but they advised her that they did not want to put her back on antibiotics as soon.  She has not followed up since.  She does state that her symptoms started out with nasal congestion and "blockage" of her nose and throat and then seemed to progress to her chest.  Patient denies any associated fevers, chest pain or tightness, pleuritic pain, lower extremity swelling.  She says she occasionally has pain of the right calf but that has been going on for several years.  No recent increase in swelling, skin color changes or increased warmth.  No history of PE or DVT.  She does have a history of RA, hypertension, collagen vascular disease, skin cancer, chronic venous insufficiency.  Patient says she has been taking Orencia for RA and it has been a significant improvement in her condition.  She has been on this for about 2 years.  Patient not taking any corticosteroids but did for a while.  Patient denies any history of RA affecting her lungs.  She did see her cardiologist 2 months ago.  Patient reports she is very bothered by the wheezing and cough.  She says she has had bronchitis in the past with similar symptoms but is never  lasted this long.  She has no other complaints.  HPI  Past Medical History:  Diagnosis Date   Cancer ([place])    squamous skin   Collagen vascular disease ([place])    Hypertension    Myocardial infarction [facility])     Patient Active Problem List   Diagnosis Date Noted   Lymphedema 08/13/2016   Chronic venous insufficiency 08/13/2016   Swelling of lower extremity 08/10/2016   Pain in both lower extremities 08/10/2016   Essential hypertension 08/10/2016    Past Surgical History:  Procedure Laterality Date   ABDOMINAL HYSTERECTOMY     CARDIAC SURGERY     stent placement    OB History   No obstetric history on file.      Home Medications    Prior to Admission medications   Medication Sig Start Date End Date Taking? Authorizing Provider  Abatacept (ORENCIA CLICKJECT) 542 MG/ML SOAJ Inject into the skin.   Yes [provider]  albuterol (VENTOLIN HFA) 108 (90 Base) MCG/ACT inhaler Inhale 1-2 puffs into the lungs every 6 (six) hours as needed for wheezing or shortness of breath. 07/26/21  Yes Laurene Footman B, PA-C  amLODipine (NORVASC) 2.5 MG tablet Take 2.5 mg by mouth daily. 06/04/21  Yes [provider]  benzonatate (TESSALON) 200 MG capsule Take 1 capsule (200 mg total) by mouth 3 (  three) times daily as needed for up to 10 days for cough. 07/26/21 08/05/21 Yes Danton Clap, PA-C  Calcium Carb-Cholecalciferol 600-25 MG-MCG TABS Take by mouth.   Yes [provider]  clopidogrel (PLAVIX) 75 MG tablet Take 75 mg by mouth daily.   Yes [provider]  lansoprazole (PREVACID) 30 MG capsule Take 30 mg by mouth daily.   Yes [provider]  losartan (COZAAR) 100 MG tablet Take 100 mg by mouth daily. 12/31/16  Yes [provider]  Multiple Vitamins-Minerals (PRESERVISION AREDS 2 PO) Take 1 tablet by mouth 2 (two) times daily.    Yes [provider]  predniSONE (DELTASONE) 20 MG tablet Take 2 tablets (40 mg total) by mouth daily  for 5 days. 07/26/21 07/31/21 Yes Danton Clap, PA-C  torsemide (DEMADEX) 20 MG tablet Take 20 mg by mouth daily as needed.  07/31/16  Yes [provider]  cetirizine (ZYRTEC) 10 MG chewable tablet Chew 10 mg by mouth daily as needed for allergies.    [provider]  hydroxychloroquine (PLAQUENIL) 200 MG tablet Take 200 mg by mouth daily.  10/30/14   [provider]  predniSONE (STERAPRED UNI-PAK 21 TAB) 10 MG (21) TBPK tablet Take by mouth daily. Dispense steroid taper pack as instructed 01/19/17   Earleen Newport, MD    Family History Family History  Problem Relation Age of Onset   Cancer Mother    Congestive Heart Failure Father    Diabetes Maternal Grandmother     Social History Social History   Tobacco Use   Smoking status: Former   Smokeless tobacco: Never  Scientific laboratory technician Use: Never used  Substance Use Topics   Alcohol use: No   Drug use: No     Allergies   Aspirin and Pregabalin   Review of Systems Review of Systems  Constitutional:  Negative for chills, diaphoresis, fatigue and fever.  HENT:  Positive for congestion. Negative for ear pain, rhinorrhea, sinus pressure, sinus pain and sore throat.   Respiratory:  Positive for cough, shortness of breath and wheezing. Negative for chest tightness.   Cardiovascular:  Negative for chest pain, palpitations and leg swelling.  Gastrointestinal:  Negative for abdominal pain, nausea and vomiting.  Musculoskeletal:  Negative for arthralgias and myalgias.  Skin:  Negative for rash.  Neurological:  Negative for weakness and headaches.  Hematological:  Negative for adenopathy.    Physical Exam Triage Vital Signs ED Triage Vitals [07/26/21 0818]  Enc Vitals Group     BP      Pulse      Resp      Temp      Temp src      SpO2      Weight 179 lb (81.2 kg)     Height 5\' 4"  (1.626 m)     Head Circumference      Peak Flow      Pain Score 0     Pain Loc      Pain Edu?      Excl. in  [place]?    No data found.  Updated Vital Signs BP (!) 149/82 (BP Location: Left Arm)   Pulse 75   Temp 98.2 F (36.8 C) (Oral)   Resp 16   Ht 5\' 4"  (1.626 m)   Wt 179 lb (81.2 kg)   SpO2 98%   BMI 30.73 kg/m      Physical Exam Vitals and nursing note reviewed.  Constitutional:  General: She is not in acute distress.    Appearance: Normal appearance. She is not ill-appearing or toxic-appearing.  HENT:     Head: Normocephalic and atraumatic.     Nose: Nose normal.     Mouth/Throat:     Mouth: Mucous membranes are moist.     Pharynx: Oropharynx is clear.  Eyes:     General: No scleral icterus.       Right eye: No discharge.        Left eye: No discharge.     Conjunctiva/sclera: Conjunctivae normal.  Cardiovascular:     Rate and Rhythm: Normal rate and regular rhythm.     Heart sounds: Normal heart sounds.  Pulmonary:     Effort: Pulmonary effort is normal. No respiratory distress.     Breath sounds: Wheezing (diffuse wheezing throughout all lung fields) present.  Musculoskeletal:     Cervical back: Neck supple.  Skin:    General: Skin is dry.  Neurological:     General: No focal deficit present.     Mental Status: She is alert. Mental status is at baseline.     Motor: No weakness.     Gait: Gait normal.  Psychiatric:        Mood and Affect: Mood normal.        Behavior: Behavior normal.        Thought Content: Thought content normal.     UC Treatments / Results  Labs (all labs ordered are listed, but only abnormal results are displayed) Labs Reviewed - No data to display  EKG   Radiology DG Chest 2 View  Result Date: 07/26/2021 CLINICAL DATA:  Cough and congestion for several months. EXAM: CHEST - 2 VIEW COMPARISON:  01/19/2017 chest radiograph and prior studies FINDINGS: Cardiomegaly and mild LEFT basilar scarring again noted. There is no evidence of focal airspace disease, pulmonary edema, suspicious pulmonary nodule/mass, pleural effusion, or  pneumothorax. No acute bony abnormalities are identified. IMPRESSION: Cardiomegaly without evidence of acute cardiopulmonary disease. Electronically Signed   By: Margarette Canada M.D.   On: 07/26/2021 08:49    Procedures Procedures (including critical care time)  Medications Ordered in UC Medications - No data to display  Initial Impression / Assessment and Plan / UC Course  I have reviewed the triage vital signs and the nursing notes.  Pertinent labs & imaging results that were available during my care of the patient were reviewed by me and considered in my medical decision making (see chart for details).  80 year old female presenting for 38-month history of cough and congestion with shortness of breath and wheezing starting the past 2 weeks.  Vitals are stable.  She is afebrile.  Oxygen is 98%.  She is overall well-appearing.  Exam significant for diffuse wheezing throughout all lung fields.  Chest x-ray ordered today and independently viewed by me shows cardiomegaly without evidence of acute cardiopulmonary disease.  Patient has known cardiomegaly.  She denies any history of heart failure.    I did review her cardiology note from Dr. Ubaldo Glassing on 05/20/2021.  Echo done in 2021 showed EF of 50 to 55% with mild AI MR and TR.Patient had several labs performed at that time including cardiac enzymes which were normal.  Patient was advised to follow back up with Dr. Ubaldo Glassing at the beginning of November but patient has not followed back up yet.  She did see her PCP on 06/04/2021.  Patient does report having negative COVID tests since her symptoms have started.  I discussed results of chest x-ray with patient.  Suspect likely viral bronchitis but we did discuss other possibilities including heart failure or other heart disease, undiagnosed pulmonary condition which could be secondary to her autoimmune disease, etc.  Treating at this time with prednisone, albuterol, and benzonatate.    Patient advised to make  appointment with her PCP.  Advised to contact them on Monday and also follow back up with cardiology.  Advised patient if symptoms continue she may need a referral to pulmonology for further evaluation.  I thoroughly reviewed ED precautions with patient and advised her to go to ED sooner than following up with PCP and specialists if any of those symptoms were to occur.  See discharge instructions.  Final Clinical Impressions(s) / UC Diagnoses   Final diagnoses:  Acute bronchitis, unspecified organism  Acute cough  Wheezing  Shortness of breath  Essential hypertension     Discharge Instructions      -Your chest x-ray does not show any evidence of pneumonia. - You do not have a fever and your oxygen is normal so I have low suspicion for pneumonia anyway.  However, you do have significant wheezing throughout your lungs.  This may be consistent with bronchitis. - We will treat you at this time with prednisone and an inhaler.  Hopefully this helps to stop the wheezing and suppress the cough.  I will send a cough medication for you to. - Make sure to stay hydrated. - Contact your PCP on Monday to try to get an appointment.  If this is an ongoing issue you may need further work-up including CT of your chest or possibly another appointment with your cardiologist for consideration of labs, echo.  Additionally, there is a possibility that you may need to see a pulmonologist at this does not improve either.  It could be that your immunocompromising conditions have affected your lungs. - For any severe acute worsening of your symptoms such as fever, severe cough, chest pain, pain in your chest on breathing, coughing up blood, weakness, increased shortness of breath, you should go to emergency department.     ED Prescriptions     Medication Sig Dispense Auth. Provider   predniSONE (DELTASONE) 20 MG tablet Take 2 tablets (40 mg total) by mouth daily for 5 days. 10 tablet Laurene Footman B, PA-C    albuterol (VENTOLIN HFA) 108 (90 Base) MCG/ACT inhaler Inhale 1-2 puffs into the lungs every 6 (six) hours as needed for wheezing or shortness of breath. 1 g Laurene Footman B, PA-C   benzonatate (TESSALON) 200 MG capsule Take 1 capsule (200 mg total) by mouth 3 (three) times daily as needed for up to 10 days for cough. 30 capsule Danton Clap, PA-C      PDMP not reviewed this encounter.   Danton Clap, PA-C 07/26/21 [phone]

## 2021-07-26 NOTE — Discharge Instructions (Signed)
-  Your chest x-ray does not show any evidence of pneumonia. - You do not have a fever and your oxygen is normal so I have low suspicion for pneumonia anyway.  However, you do have significant wheezing throughout your lungs.  This may be consistent with bronchitis. - We will treat you at this time with prednisone and an inhaler.  Hopefully this helps to stop the wheezing and suppress the cough.  I will send a cough medication for you to. - Make sure to stay hydrated. - Contact your PCP on Monday to try to get an appointment.  If this is an ongoing issue you may need further work-up including CT of your chest or possibly another appointment with your cardiologist for consideration of labs, echo.  Additionally, there is a possibility that you may need to see a pulmonologist at this does not improve either.  It could be that your immunocompromising conditions have affected your lungs. - For any severe acute worsening of your symptoms such as fever, severe cough, chest pain, pain in your chest on breathing, coughing up blood, weakness, increased shortness of breath, you should go to emergency department.

## 2021-07-26 NOTE — ED Triage Notes (Signed)
Patient states that she has had cough and chest congestion since September.  Patient states that she has started having some wheezing and SOB earlier this week.  Patient denies fevers.

## 2021-07-28 ENCOUNTER — Ambulatory Visit: Admit: 2021-07-28 | Discharge: 2021-07-29 | Payer: MEDICARE

## 2021-07-28 DIAGNOSIS — D492 Neoplasm of unspecified behavior of bone, soft tissue, and skin: Principal | ICD-10-CM

## 2021-07-28 DIAGNOSIS — L578 Other skin changes due to chronic exposure to nonionizing radiation: Principal | ICD-10-CM

## 2021-07-28 DIAGNOSIS — L570 Actinic keratosis: Principal | ICD-10-CM

## 2021-07-28 DIAGNOSIS — Z85828 Personal history of other malignant neoplasm of skin: Principal | ICD-10-CM

## 2021-07-28 DIAGNOSIS — C44722 Squamous cell carcinoma of skin of right lower limb, including hip: Principal | ICD-10-CM

## 2021-07-28 NOTE — Unmapped (Addendum)
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   Discussed options for treating field cancerization and their associated risks and benefits. Reviewed role of acitretin but she is cautious to add new medication. She would need a baseline lipid panel. She is open to repeat discussion at next visit.   She would like to avoid aggressive surgeries as she is starting to feel overwhelmed with number of skin cancers and rate of progression of new lesions.   - continue niacinamide 500mg  BID  - consider acitretin at next visit  - opted for cryotherapy today    Cryotherapy Procedure Note:   After R/B/A discussed (including scarring, pigment alteration, recurrence, or persistence of the lesion) and verbal consent was obtained, identified lesions were treated with liquid nitrogen x 1 ten second freeze-thaw cycle. The patient tolerated the procedure well and was instructed on post-procedure care.  Total lesions frozen: 10  Location and number: bilateral lower legs and feet x6, left arm x1, left dorsal wrist x1. Posterior neck x2    Persistent SCC of the right shin  S/p IL 5FU and ED&C on 03/2021. Treatment failed. Discussed role of Mohs as treatment of choice in setting of failed conservative therapy. She opts for repeat ED&C and IL 5FU today and will consider surgery if fails again.   - ED&C and IL-5FU as below:    Curettage was performed x 3 passes with final diameter after first pass measuring 2.8cm. The base was injected with 5-Fluorouracil x 0.4 mL. Area was cleaned, dressed and post-procedure wound care instructions were reviewed.     Lot #: [id number]  Exp: 05/01/2022    Treatment Area (# of treatments by date) Tumor Size (mm)   right shin (treatment #2) SCC mod-well 32 mm          Neoplasm(ia) of unspecified etiology:  - To help confirm diagnosis, biopsy/biopsies obtained today. Biopsy (Shave) Procedure Note:   After R/B/A discussed (including scarring, pigment alteration, recurrence, or persistence of the lesion) and verbal consent was obtained, the area was marked, photographed, prepped with alcohol, and anesthetized with lidocaine 2% with epinephrine. Biopsy(ies) performed using a shave technique. Hemostasis was achieved with pressure, aluminum chloride, Monsel's, and/or electrocautery. Area was dressed with petrolatum and bandage. Wound care instructions were provided. We will contact the patient with results when available. Patient agrees to be notified of results by MyChart - even if needs further management.   A) Location: right great toe, DDx: BCC vs SCC vs AK vs KA vs SK         RTC: Return in about 2 months (around 09/27/2021).     _________________________________________________________________      Chief Complaint     Chief Complaint   Patient presents with   ??? Lesion Of Concern     Spot on right big toe. Pt states areas appeared after last visit but has grown since she noticed.   Some bleeding and pain because of shoes rubbing areas.        HPI     Shelly Cooper is a 80 y.o. female who presents as a returning patient (last seen 04/21/2021) to [facility] for multiple lesions of concern. At last visit she underwent ED&C of a KA on the left thigh. She also had 2 new areas (A - right thigh and B-right shin) treated with biopsy  and same day curettage and IL 5-FU which showed invasive SCC. The right thigh and left thigh lesions are clinically resolved today. The right shin did not resolve and has persisted and seems to have gotten bigger. She also has a new lesion on the right great toe which is bleeding and painful. It is starting to deform the nail. It is new since last visit.     She is getting very discouraged with continued progression of skin cancers despite frequent visits and surgeries/procedures. She continues to take her niacinamide 500mg  BID without much obvious benefit.    The patient denies any other new or changing lesions or areas of concern.     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     ROS: Other than symptoms mentioned in the HPI, no fevers, chills, or other skin complaints    Physical Examination     GENERAL: Well-appearing female in no acute distress, resting comfortably.  NEURO: Alert and oriented, answers questions appropriately  SKIN (Focal Skin Exam): Per patient request, examination of legs and forearms and posterior neck was performed  - multiple keratotic papules and plaques on the bilateral lower extremities concerning for NMSC  - prior ED&C of the left thigh and right thigh shows developing scar without nodularity, scaling, or ulceration  - prior ED&C site of the right shin shows scar with contiguous and new nodularity, erythema, ulceration, and hyperkeratosis      Number of Biopsies: 1  Description (Biopsy A): keratotic nodule  Type of biopsy (Biopsy A): Shave  Differential (Biopsy A): Neoplasia - Uncertain etiology  Neoplasia (Biopsy A): BCC vs. SCC vs. AK vs. KA vs. SK  Site (Biopsy A): Toe, Right  --------------------- DX: ______________________ Stefan Church / N - EDC, EXCIS, MOHS, TOPICAL, PDT, OBSERV  Triangulation (Biopsy A): n/a  Size of Lesion (Biopsy A): 13mm  Release to patient: Immediate                  All areas not commented on are within normal limits or unremarkable      (Approved Template 05/13/2020)

## 2021-07-28 NOTE — Unmapped (Signed)
It was nice to see you today! Your resident physician was Dr. Marvis Moeller    If any of your medications are too expensive, look for a coupon at [email or web address] or reference the application on a smartphone  - Enter the medication name, size, and your zip code to find coupons for local pharmacies.   - Print a coupon and bring it to the pharmacy, or pull up the coupon on a smartphone.  - You can also call pharmacies to ask about the cost of your medication before you pick it up.  If you still cannot afford your medication, please let Shelly Cooper know.     Please call our clinic at [phone] with any concerns. We look forward to seeing you again!    [facility] releases most results to you as soon as they are available. Therefore, you may see some results before we do. Please give Shelly Cooper 2 business days to review the tests and contact you by phone or through MyChart. If you are concerned that some results may be upsetting or confusing, you may wish to wait until we contact you before looking at the report in MyChart.  If you have an urgent question, you can send Shelly Cooper a message or call our clinic. Otherwise, we prefer that you wait 2 business days for Shelly Cooper to contact you.     Managing Your Wound After Skin Surgery  Please avoid strenuous activity for 48 hours and all heavy exercise for one week.  If your wound is on your arm, leg, or shoulder, then avoid heavy lifting, straining, or exercise with that limb for at least one week.  If your wound is on the head or neck, avoid bending over and sleep with your head elevated for 48 hours to reduce the risk of bleeding.  Please do not smoke for 3 weeks; smoking prevents healthy wound healing.  Pain Management: Use 650mg  of acetaminophen (Tylenol) every 4 hours, or medication that we prescribe as needed.  Pain should decrease steadily for the first few days after your surgery.  Avoid ibuprofen (Motrin), naproxen (Aleve), aspirin, or alcohol for 48 hours because they can increase the risk of bleeding. You should continue aspirin if you already take it regularly.  Possible complications:  Bleeding: A small amount of blood on the bandage is normal.  For significant bleeding into the bandage or beneath the stitches (this will look like swelling and purple discoloration), apply firm pressure with a cloth or bandage for 20 minutes without interruption.  Repeat if bleeding continues.  If it persists, please call [phone].  During non-office hours use the message at this number to contact the on-call dermatologist or go to the emergency room.  Infection:  Usually indicated by pain that increases 4-6 days after surgery. Mild redness, swelling, and soreness are normal, but with increasing redness, pain, drainage, and swelling you should contact our office.  Decreased sensation, increased sensitivity, and itching may last for 18 months.  Scarring: Scars mature for one year after surgery.  Reducing motion and tension over the wound for the first month, as well as sun-protection, reduces scarring.    Bruising is normal around the wound and resolves over 2-3 weeks.    For Wounds with Dissolving Stitches:  Please keep the original bandage in place for at least 24 hours.  You can remove the large outer layer at that time and keep the smaller bandage clean and dry for one week or until you return.    If the bandage  becomes soiled, wet, or detached you can reinforce it with tape or add petrolatum (Vaseline) to the stitches, then place a fresh, clean, non-stick bandage (ie Telfa) with tape or Band-aid.    For Wounds with Stitches that Need to be Removed:  Please keep the original bandage in place for at least 24 hours.  You can remove the large outer layer at that time.  You may leave the smaller bandage in place until it falls off by itself, or change it after it gets wet with bathing.  To change, apply a thick layer of petrolatum (Vaseline) over top of the Steri-strips (thin white strips) and cover with a non-stick bandage (ie Telfa) with tape or a  Band-aid.  If you are changing the bandage you can gently remove any crust with warm water and mild soap using a soft cloth or Q-tip.  Do not use triple-antibiotic ointment, Neosporin, or hydrogen peroxide.    For open wounds:  Each day or each time the dressing is soiled or disrupted, gently remove any excess crusting or oozing with a warm wet wash cloth or gently allow water to run over the area. Apply a thick layer of Vaseline, and then re-cover the area with a non-stick bandage and tape or another bandage of your choosing. The thick layer of Vaseline will help soothe the pain of the wound and allow it to heal faster.    If crusting builds up a warm washcloth with mild soap or a mixture of 1 tablespoon of white vinegar in a cup of warm can be applied to the area for 5-10 minutes and then used to gently wipe away any excess.    Healing is best when the wound fills in from the bottom and sides. If the edges of the wound are close together it is important to keep the Vaseline in the wound and use the non-stick dressing pressed gently into the wound to keep the edges from sealing together first.    After the Stitches and Bandages Have Been Removed:  Clean daily with warm water and gentle soap using a soft cloth.  You can continue to use a small bandage and sunscreen for 1-2 months or until fully healed.

## 2021-08-04 NOTE — Unmapped (Signed)
Patient notified via Phone. 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 Superficial biopsy but lesion is large and very likely represents an invasive SCC.     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

## 2021-08-04 NOTE — Unmapped (Signed)
Addended by: Sydnee Levans A on: 08/04/2021 08:53 AM     Modules accepted: Orders

## 2021-08-07 NOTE — Unmapped (Signed)
Mohs referral

## 2021-08-12 ENCOUNTER — Ambulatory Visit: Admit: 2021-08-12 | Discharge: 2021-08-13 | Payer: MEDICARE

## 2021-08-12 NOTE — Unmapped (Signed)
Caprice Beaver, M.D.  [facility]  OPEN WOUND CARE  No strenuous activity for 48 hours.    Plan to sleep with your wound elevated.  Avoid bending over, as this can cause bleeding to occur.    Take Tylenol as needed for discomfort (do not exceed two 325mg  tablets every 4 hours).    No alcoholic beverages for 48 hours.    Avoid smoking, as it is detrimental to wound healing.    Keep the pressure bandage in place for 24 hours; this helps prevent bleeding. If the bandage becomes blood-tinged or loose, reinforce it with gauze and tape.      Remove bandages in 24 hours and begin daily wound care as follows:  Clean the area with soapy water using a Q-tip or gauze pad (shower/bathe normally).  Dry the wound with a Q-tip or gauze pad.  Apply Vaseline, Polysporin or Bacitracin ointment with a Q-tip.   * Do NOT use Neosporin ointment *  Cover the wound with a Band-Aid or non-stick gauze pad and paper tape.  Repeat wound care once a day until the wound is completely healed.  Do not let the wound dry out. The wound will heal faster with a better cosmetic result if it is kept moist with ointment and covered with a bandage.    Massaging the area will help the scar soften and fade more quickly.  Begin to massage the area several times a day for a few minutes at a time only after healing is complete.    If active bleeding occurs:  Use tightly rolled up gauze or cloth to apply direct pressure over the bandage for 20 minutes.  Reapply pressure for an additional 20 minutes if necessary.  If two rounds of pressure fail to stop the bleeding, call [phone] during office hours. For after hours, call [phone] or go to the nearest emergency room.  Use additional gauze and tape to maintain pressure once the bleeding has stopped.    Wound Healing:  One week after surgery, a pink/red halo will form around the outside of the wound; this is new skin.  The center of the wound will appear yellowish-white and produce drainage.  The pink halo will slowly migrate toward the center of the wound until the wound is covered with shiny, pink skin.  There will be no more drainage when the wound is completely healed.   It will take six months to one year for the redness to fade.  The scar may be itchy, tight and sensitive to extreme temperatures for up to one year after surgery.  For any questions or concerns, call the Mohs Clinic: [phone]  For after-hours emergencies within 48 hours of surgery, call Dr. Merritt???s cell phone: [phone]. For other emergencies, call the dermatology resident on call: [phone]

## 2021-08-12 NOTE — Unmapped (Signed)
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   3) Prognosis and future surveillance discussed.  4) Letter with treatment outcome sent to referring provider.       Photodamage/future skin cancer risk: evidenced by rhytids, telangiectasias, and lentigines, and history of skin cancer  1) Currently stable, no concerning lesions on exam today. Actinic damage-sun protection measures discussed/advised.   2) Advise continued surveillance in addition to photoprotection.        INDICATION FOR MOHS SURGERY: location, size, histology, or recurrence    STAGE I: For the right great toe timeout performed at 9:22 a.m. Informed consent obtained. Anesthesia achieved with 0.33% lidocaine with 1:200,000 epinephrine. ChloraPrep applied. one section(s) excised using Mohs technique (this includes total peripheral and deep tissue margin excision and evaluation with frozen sections, excised and interpreted by the same physician).     Frozen section analysis revealed a positive margin with SCCIS.  Debulking showed only SCCIS.    Stage 2: One section excised using Mohs technique.  Frozen section analysis revealed a negative margin.  Marland Kitchen      Options for management of the wound were discussed. Given the location and size/depth of the wound, we discussed second intention healing versus repair.  After discussion of all risks and benefits of each approach, we agreed on second intention healing as appropriate.      Bandage applied and wound management reviewed.    Final size of the wound: 2.0 cm    RTC in six weeks.          HISTORY OF PRESENT ILLNESS: [name] is seen in consultation at the request of Dr. Orma Flaming  for biopsy-proven SCCIS.   They note that the area has been present for about a few months  increasing in size with scaling .  There is no history of previous treatment.  Reports no other new or changing lesions and has no other complaints today.      REVIEW OF SYSTEMS: No fevers, chills, weight loss, lymphadenopathy or other skin concerns.    PHYSICAL EXAMINATION:  GENERAL:  well-appearing in no acute distress, alert, appropriately oriented and interactive.  SKIN: Examination of the relevant anatomic areas notable for a scaly 1.9 cm plaque on the right toe. There are rhytids, telangiectasias, and lentigines, consistent with photodamage.    Biopsy report(s) reviewed, confirming the diagnosis.

## 2021-08-20 ENCOUNTER — Emergency Department: Admit: 2021-08-20 | Discharge: 2021-08-21 | Disposition: A | Discharge status: Home / Self Care | Payer: MEDICARE

## 2021-08-20 ENCOUNTER — Ambulatory Visit: Admit: 2021-08-20 | Discharge: 2021-08-21 | Disposition: A | Discharge status: Home / Self Care | Payer: MEDICARE

## 2021-08-20 LAB — CBC W/ AUTO DIFF
BASOPHILS ABSOLUTE COUNT: 0.1 10*9/L (ref 0.0–0.1)
BASOPHILS RELATIVE PERCENT: 0.8 %
EOSINOPHILS ABSOLUTE COUNT: 0.3 10*9/L (ref 0.0–0.5)
EOSINOPHILS RELATIVE PERCENT: 3.2 %
HEMATOCRIT: 41.5 % (ref 34.0–44.0)
HEMOGLOBIN: 13.8 g/dL (ref 11.3–14.9)
LYMPHOCYTES ABSOLUTE COUNT: 2.2 10*9/L (ref 1.1–3.6)
LYMPHOCYTES RELATIVE PERCENT: 24.5 %
MEAN CORPUSCULAR HEMOGLOBIN CONC: 33.3 g/dL (ref 32.0–36.0)
MEAN CORPUSCULAR HEMOGLOBIN: 30 pg (ref 25.9–32.4)
MEAN CORPUSCULAR VOLUME: 90.2 fL (ref 77.6–95.7)
MEAN PLATELET VOLUME: 7.1 fL (ref 6.8–10.7)
MONOCYTES ABSOLUTE COUNT: 0.7 10*9/L (ref 0.3–0.8)
MONOCYTES RELATIVE PERCENT: 7.9 %
NEUTROPHILS ABSOLUTE COUNT: 5.8 10*9/L (ref 1.8–7.8)
NEUTROPHILS RELATIVE PERCENT: 63.6 %
NUCLEATED RED BLOOD CELLS: 0 /100{WBCs} (ref <=4)
PLATELET COUNT: 351 10*9/L (ref 150–450)
RED BLOOD CELL COUNT: 4.6 10*12/L (ref 3.95–5.13)
RED CELL DISTRIBUTION WIDTH: 13.6 % (ref 12.2–15.2)
WBC ADJUSTED: 9.1 10*9/L (ref 3.6–11.2)

## 2021-08-20 LAB — URINALYSIS WITH CULTURE REFLEX
BILIRUBIN UA: NEGATIVE
BLOOD UA: NEGATIVE
GLUCOSE UA: NEGATIVE
KETONES UA: NEGATIVE
NITRITE UA: NEGATIVE
PH UA: 5.5 (ref 5.0–9.0)
PROTEIN UA: 30 — AB
RBC UA: 70 /HPF — ABNORMAL HIGH (ref <=4)
SPECIFIC GRAVITY UA: 1.021 (ref 1.003–1.030)
SQUAMOUS EPITHELIAL: 35 /HPF — ABNORMAL HIGH (ref 0–5)
UROBILINOGEN UA: <2
WBC UA: >182 /HPF — ABNORMAL HIGH (ref 0–5)

## 2021-08-20 LAB — COMPREHENSIVE METABOLIC PANEL
ALBUMIN: 3.8 g/dL (ref 3.4–5.0)
ALKALINE PHOSPHATASE: 110 U/L (ref 46–116)
ALT (SGPT): 12 U/L (ref 10–49)
ANION GAP: 10 mmol/L (ref 5–14)
AST (SGOT): 19 U/L (ref <=34)
BILIRUBIN TOTAL: 0.6 mg/dL (ref 0.3–1.2)
BLOOD UREA NITROGEN: 20 mg/dL (ref 9–23)
BUN / CREAT RATIO: 22
CALCIUM: 9.4 mg/dL (ref 8.7–10.4)
CHLORIDE: 104 mmol/L (ref 98–107)
CO2: 26.2 mmol/L (ref 20.0–31.0)
CREATININE: 0.89 mg/dL — ABNORMAL HIGH
EGFR CKD-EPI (2021) FEMALE: 66 mL/min/{1.73_m2} (ref >=60)
GLUCOSE RANDOM: 113 mg/dL (ref 70–179)
POTASSIUM: 3.5 mmol/L (ref 3.4–4.8)
PROTEIN TOTAL: 7 g/dL (ref 5.7–8.2)
SODIUM: 140 mmol/L (ref 135–145)

## 2021-08-20 LAB — LIPASE: LIPASE: 28 U/L (ref 12–53)

## 2021-08-20 MED ORDER — HYDROCODONE 10 MG-CHLORPHENIRAMINE 8 MG/5 ML ORAL SUSP EXTEND.REL 12HR
Freq: Every evening | ORAL | 0 refills | 5.00000 days | Status: CP | PRN
Start: 2021-08-20 — End: 2021-08-25

## 2021-08-20 MED ORDER — BENZONATATE 100 MG CAPSULE
ORAL_CAPSULE | Freq: Three times a day (TID) | ORAL | 0 refills | 7 days | Status: CP
Start: 2021-08-20 — End: 2021-08-27

## 2021-08-20 MED ORDER — CEPHALEXIN 500 MG CAPSULE
ORAL_CAPSULE | Freq: Four times a day (QID) | ORAL | 0 refills | 10 days | Status: CP
Start: 2021-08-20 — End: 2021-08-30

## 2021-08-21 MED ORDER — AMOXICILLIN-POTASSIUM CLAVULANATE 1,000 MG-62.5 MG TABLET,EXT.REL 12HR
ORAL_TABLET | Freq: Two times a day (BID) | ORAL | 0 refills | 10 days | Status: CP
Start: 2021-08-21 — End: 2021-08-31

## 2021-08-21 MED ADMIN — sodium chloride 0.9% (NS) bolus 1,000 mL: 1000 mL | INTRAVENOUS | @ 04:00:00 | Stop: 2021-08-20

## 2021-08-21 MED ADMIN — iohexoL (OMNIPAQUE) 350 mg iodine/mL solution 100 mL: 100 mL | INTRAVENOUS | @ 03:00:00 | Stop: 2021-08-20

## 2021-08-21 MED ADMIN — guaiFENesin (ROBITUSSIN) oral syrup: 200 mg | ORAL | @ 04:00:00

## 2021-08-21 MED ADMIN — cefTRIAXone (ROCEPHIN) 1 g in sodium chloride 0.9 % (NS) 100 mL IVPB-connector bag: 1 g | INTRAVENOUS | @ 04:00:00 | Stop: 2021-08-20

## 2021-08-21 NOTE — Unmapped (Signed)
ED Progress Note    This patient, Shelly Cooper, MRN [id number], was signed out to me today, 08/21/2021 by Dr. Hyacinth Meeker.  I have reviewed the pertinent vital signs, labs, imaging, and prior provider and nursing notes.  Patient is a 80 yo female who presented with lower abdominal pain, sent in for concern for diverticulitis.  Pt found to have UTI, given rocephin.  Also with ongoing cough after recent influenza.  Patient is pending CT read.  Plan is to dc with antibiotics.    CT Abdomen Pelvis W IV Contrast Only   Preliminary Result   Uncomplicated sigmoid diverticulitis.      Additional chronic and incidental findings are unchanged from prior.        Plan for augmentin to cover for both UTI and diverticulitis.  Return precautions discussed.

## 2021-08-21 NOTE — Unmapped (Shared)
[facility] Emergency Department Provider Note    ED Clinical Impression     Final diagnoses:   None       ***    Initial Impression, ED Course, Assessment and Plan     Shelly Cooper is a 80 y.o. female with a PMH of HTN, MI, GERD, CKD, SCC, and basal cell carcinoma  presenting with two weeks of lower abdominal pain and persistent cough.     On exam patient is well appearing and in NAD. VS are notable for HTN to 194/93, otherwise unremarkable. Regular rhythm. Lungs CTAB. Physical exam significant for minimal suprapubic TTP. No rebound guarding. No palpable masses.     Concern for ***    Basic labs, UA, lipase, and rapid flu/RSV/covid test ordered in triage. UA consistent with UTI. Lab work otherwise unremarkable.      Will order CT a/p.     10:58 PM   Patient signed out pending CT.      I independently visualized the EKG tracing.   I independently visualized the radiology images.   I reviewed the patient's prior medical records (***).     {** Also document the following if done:  I discussed the case with the *** consultant.   I discussed the case with the admitting provider.   I discussed the case with the radiologist.   I discussed the case with the ED pharmacist.   I discussed the case with ***.   I obtained additional history from ***.       ____________________________________________    Time seen: August 20, 2021 9:52 PM       History     Chief Complaint  Abdominal Pain      HPI   Shelly Cooper is a 80 y.o. female with PMH of HTN, MI, GERD, CKD, SCC, and basal cell carcinoma presenting today for evaluation of abdominal pain. The patient reports lower abdominal pain and persistent cough. She reports that the cough began in October, and she was diagnosed with sinusitis. She has been treated with two courses of antibiotics for her cough with no relief. On 12/1 she was diagnosed with influenza A. She notes new shortness of breath along with the cough. She has tried tessalon pearls, warm tea and honey, and humidifiers with no relief. Of note, the patient is on losartan for her HTN. She denies chest pain, lower extremity swelling, productive cough.      Additionally, she reports two weeks of lower abdominal tenderness with sharp pains through her rectum when she stands for long periods of time. She states that the pain is worse when her bladder is full. She notes that her stools have been a peanut butter consistency over the last few weeks. The patient says she has unintentionally lost 10 lbs. She first attributed these symptoms to her antibiotic use. She denies dysuria, hematuria, increased urinary frequency, vaginal discharge or bleeding, constipation, or hematochezia.    Past Medical History:   Diagnosis Date   ??? Actinic keratosis    ??? Allergic    ??? Arthritis    ??? Basal cell carcinoma    ??? Chronic kidney disease    ??? Eczema ?   ??? GERD (gastroesophageal reflux disease)    ??? Heart disease    ??? Hypertension    ??? Myocardial infarction (CMS-HCC) 1999, 2001    Stents, 2001   ??? Rheumatoid arthritis(714.0)    ??? Squamous cell skin cancer    ??? Trigger finger  Past Surgical History:   Procedure Laterality Date   ??? HYSTERECTOMY  1978    fibroids   ??? NASAL POLYP EXCISION     ??? SKIN BIOPSY     ??? STENTS [facility] HISTORICAL RESULT)  2001    post-MI   ??? TRIGGER FINGER RELEASE           Current Facility-Administered Medications:   ???  cefTRIAXone (ROCEPHIN) 1 g in sodium chloride 0.9 % (NS) 100 mL IVPB-connector bag, 1 g, Intravenous, Once, Diane Burman Riis, MD  ???  guaiFENesin (ROBITUSSIN) oral syrup, 200 mg, Oral, Q4H PRN, Jamas Lav, MD  ???  sodium chloride 0.9% (NS) bolus 1,000 mL, 1,000 mL, Intravenous, Once, Jamas Lav, MD    Current Outpatient Medications:   ???  abatacept (ORENCIA CLICKJECT) 125 mg/mL AtIn subcutaneous auto-injector, Inject the contents of 1 pen (125 mg) under the skin once a week., Disp: 12 mL, Rfl: 3  ???  amLODIPine (NORVASC) 5 MG tablet, Take 1 tablet (5 mg total) by mouth daily., Disp: 90 tablet, Rfl: 3  ???  clobetasoL (TEMOVATE) 0.05 % ointment, Apply topically Two (2) times a day., Disp: 60 g, Rfl: 1  ???  clopidogrel (PLAVIX) 75 mg tablet, Take 75 mg by mouth daily., Disp: , Rfl:   ???  fluorouraciL (EFUDEX) 5 % cream, Apply topically Two (2) times a day., Disp: 40 g, Rfl: 2  ???  ipratropium (ATROVENT) 21 mcg (0.03 %) nasal spray, , Disp: , Rfl:   ???  lansoprazole (PREVACID) 30 MG capsule, Take 1 capsule by mouth once daily., Disp: , Rfl:   ???  losartan (COZAAR) 100 MG tablet, Take 100 mg by mouth daily. , Disp: , Rfl:   ???  torsemide (DEMADEX) 20 MG tablet, Take 20 mg by mouth daily as needed. , Disp: , Rfl:     Allergies  Aspirin and Lyrica  [pregabalin]    Family History   Problem Relation Age of Onset   ??? Cancer Mother 75        pancreatic cancer   ??? Hyperlipidemia Father    ??? Heart disease Father    ??? Basal cell carcinoma Sister    ??? Squamous cell carcinoma Sister    ??? No Known Problems Daughter    ??? Diabetes Maternal Grandmother    ??? No Known Problems Maternal Grandfather    ??? No Known Problems Paternal Grandmother    ??? No Known Problems Paternal Grandfather    ??? Diabetes Maternal Uncle    ??? No Known Problems Other    ??? Melanoma Neg Hx    ??? BRCA 1/2 Neg Hx    ??? Breast cancer Neg Hx    ??? Colon cancer Neg Hx    ??? Endometrial cancer Neg Hx    ??? Ovarian cancer Neg Hx        Social History  Social History     Tobacco Use   ??? Smoking status: Former     Packs/day: 1.00     Years: 3.00     Pack years: 3.00     Types: Cigarettes     Start date: 01/29/1969     Quit date: 03/05/1972     Years since quitting: 49.4   ??? Smokeless tobacco: Never   Vaping Use   ??? Vaping Use: Never used   Substance Use Topics   ??? Alcohol use: Never     Alcohol/week: 0.0 standard drinks   ??? Drug use: Never  Review of Systems  Constitutional: Negative for fever.  Eyes: Negative for visual changes.  ENT: Negative for sore throat.  Cardiovascular: Negative for chest pain.  Respiratory: Positive for cough. Negative for shortness of breath.  Gastrointestinal: Positive for abdominal pain, diarrhea.  Genitourinary: Negative for dysuria.  Musculoskeletal: Negative for back pain.  Skin: Negative for rash.  Neurological: Negative for headaches, weakness or numbness.      Physical Exam     VITAL SIGNS:    ED Triage Vitals [08/20/21 1707]   Enc Vitals Group      BP 178/85      Heart Rate 101      SpO2 Pulse       Resp 22      Temp 36.8 ??C (98.2 ??F)      Temp Source Oral      SpO2 98 %     Constitutional: Alert and oriented. Well appearing and in no distress.  Eyes: Conjunctivae are normal.  ENT       Head: Normocephalic and atraumatic.       Nose: No congestion.       Mouth/Throat: Mucous membranes are moist.       Neck: No stridor, neck is supple.  Hematological/Lymphatic/Immunilogical: No cervical lymphadenopathy.  Cardiovascular: Normal rate, regular rhythm. No m/g/r. Normal and symmetric distal pulses are present in all extremities.   Respiratory: Normal respiratory effort. Breath sounds are normal. No wheezes, rales, rhonchi.  Gastrointestinal: Soft, with minimal suprapubic tenderness. No rebound or guarding. No palpable masses. There is no CVA tenderness.  Genitourinary: N/a  Musculoskeletal: Nontender with normal range of motion in all extremities.       Right lower leg: No tenderness or edema.       Left lower leg: No tenderness or edema.  Neurologic: Normal speech and language. No gross focal neurologic deficits are appreciated.   Skin: Skin is warm, dry and intact. No rash noted.  Psychiatric: Mood and affect are normal. Speech and behavior are normal.      EKG     ***    Radiology     CT Abdomen Pelvis W IV Contrast Only    (Results Pending)         Procedures     ***      Pertinent labs & imaging results that were available during my care of the patient were reviewed by me and considered in my medical decision making (see chart for details).    Documentation assistance was provided by Caro Hight, Scribe on August 20, 2021 at 9:52 PM for Idalia Needle, MD.    {***Note to provider: please use .[email or web address] to enter a scribe attestation}

## 2021-08-21 NOTE — Unmapped (Signed)
Here from [facility] for possible diverticulitis.  Having abd pain since last week and also a cough since October. Flu + Dec.1.

## 2021-09-02 DIAGNOSIS — U071 COVID-19: Principal | ICD-10-CM

## 2021-09-02 MED ORDER — PAXLOVID 300 MG (150 MG X 2)-100 MG TABLETS IN A DOSE PACK (EUA)
ORAL_TABLET | 0 refills | 0 days | Status: CP
Start: 2021-09-02 — End: ?

## 2021-09-02 NOTE — Unmapped (Signed)
Called, no answer.

## 2021-09-08 NOTE — Unmapped (Signed)
Reason for call:     Pt is calling as she was told this morning that her [name] re-enrollment was missing information from provider' portion and that page 4 was needing to be completed and faxed back.    Pt is completely out of medication and is asking if someone can expedite getting this resolved.    Last ov: Visit date not found  Next ov: 10/01/2021

## 2021-09-09 ENCOUNTER — Ambulatory Visit
Admission: RE | Admit: 2021-09-09 | Discharge: 2021-09-09 | Disposition: A | Discharge status: Home / Self Care | Payer: Medicare Other | Source: Ambulatory Visit | Attending: Internal Medicine | Admitting: Internal Medicine

## 2021-09-09 ENCOUNTER — Other Ambulatory Visit: Payer: Self-pay

## 2021-09-09 VITALS — BP 140/81 | HR 83 | Temp 98.7°F | Resp 18 | Ht 64.0 in | Wt 169.0 lb

## 2021-09-09 DIAGNOSIS — R600 Localized edema: Secondary | ICD-10-CM

## 2021-09-09 DIAGNOSIS — R058 Other specified cough: Secondary | ICD-10-CM

## 2021-09-09 DIAGNOSIS — K6289 Other specified diseases of anus and rectum: Secondary | ICD-10-CM

## 2021-09-09 MED ORDER — ALBUTEROL SULFATE HFA 108 (90 BASE) MCG/ACT IN AERS
1.0000 | INHALATION_SPRAY | RESPIRATORY_TRACT | 0 refills | Status: AC | PRN
Start: 2021-09-09 — End: ?

## 2021-09-09 MED ORDER — AMOXICILLIN-POT CLAVULANATE 875-125 MG PO TABS: 1.0000 | ORAL_TABLET | Freq: Two times a day (BID) | ORAL | 0 refills | Status: AC

## 2021-09-09 NOTE — Discharge Instructions (Addendum)
Several things going on today.   1-Recent (09/01/21) covid infection with persistent productive cough.  Lung exam was symmetric; mild coarseness heard throughout, suggests bronchial irritation.  I did not hear a pneumonia today.  Prescription for Albuterol inhaler sent to pharmacy.  Continue tessalon perles and might try codeine cough syrup you have at home; try this when you have someone at home to help you, in case it makes you lightheaded.  Codeine can increase the risk of falling.   2-right cheek/jaw painful swelling that started today.  Parotid gland irritation, can be viral or sometimes grit or mucus block the gland.  Push lemon drops today.  If worse tomorrow, start the prescription for amoxicillin/clavulanate (antibiotic for infection).   3--irritation of bottom.  Try vaseline to soothe/protect area from stool.  Avoid vigorous cleanings/wipes right now--clean gently and with just water right now to let the tissues heal.  Blot dry after having bowel movements.  Can take several weeks for bowels to heal after diverticulitis and antibiotics.  Can take a couple weeks for coughing to resolve after respiratory infections. Recheck for new fever >100.5, marked increased sleepiness/lethargy, marked increase in BM frequency/explosiveness/wateriness, marked increase in phlegm production.

## 2021-09-09 NOTE — ED Triage Notes (Signed)
Pt states that she tested positive for covid last Monday. Pt c/o cough, stomach pain, body aches, swelling on the right side of the neck x2weeks.   Pt was treating a UTI and then Diverticulosis before she was became sick with covid.

## 2021-09-09 NOTE — ED Provider Notes (Signed)
[facility]    CSN: [id number] Arrival date & time: 09/09/21  0845      History   Chief Complaint Chief Complaint  Patient presents with   Cough    Appt @ 9    HPI Sally Lee is a 81 y.o. female.  She presents today with complicated recent medical history.  She was admitted to the hospital briefly with UTI and diverticulosis, at [facility], just before Christmas.  She was treated with antibiotics.  1-Bowels have improved, but not quite to baseline.  Bottom is sore.  Occ fecal incontinence with hard coughing (new in last week, see #2). 2-Diagnosed with covid 09/01/21, took paxlovid, still having a lot of coughing, some production.  No fever.  Hard to sleep for coughing.  Appetite is poor--things don't taste right.  Weight loss about 10 lbs since early December. 3-This morning, awakened with soreness/mild swelling in her right cheek/jaw.  No bad teeth.  No pain with chewing, but usually chews on the left.  Quite sore to touch.  Not red. 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  Rheumatologist advised her to hold Orencia for next 2-4 weeks to allow healing from recent medical issues.      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 Authorizing Provider  Abatacept (ORENCIA CLICKJECT) 182 MG/ML SOAJ Inject into the skin.   Yes [provider]  albuterol (VENTOLIN HFA)  108 (90 Base) MCG/ACT inhaler Inhale 1-2 puffs into the lungs every 4 (four) hours as needed for wheezing or shortness of breath. 09/09/21  Yes Wynona Luna, MD  amLODipine (NORVASC) 2.5 MG tablet Take 2.5 mg by mouth daily. 06/04/21  Yes [provider]  amoxicillin-clavulanate (AUGMENTIN) 875-125 MG tablet Take 1 tablet by mouth 2 (two) times daily for 5 days. 09/09/21 09/14/21 Yes Wynona Luna, MD  Calcium Carb-Cholecalciferol 600-25 MG-MCG TABS Take by mouth.   Yes [provider]  clopidogrel (PLAVIX) 75 MG tablet Take 75 mg by mouth daily.   Yes [provider]  lansoprazole (PREVACID) 30 MG capsule Take 30 mg by mouth daily.   Yes [provider]  losartan (COZAAR) 100 MG tablet Take 100 mg by mouth daily. 12/31/16  Yes [provider]  torsemide (DEMADEX) 20 MG tablet Take 20 mg by mouth daily as needed.  07/31/16  Yes [provider]  cetirizine (ZYRTEC) 10 MG chewable tablet Chew 10 mg by mouth daily as needed for allergies.    [provider]  hydroxychloroquine (PLAQUENIL) 200 MG tablet Take 200 mg by mouth daily.  10/30/14   [provider]  Multiple Vitamins-Minerals (PRESERVISION AREDS 2 PO) Take 1 tablet by mouth 2 (two) times daily.     [provider]    Family History Family History  Problem Relation Age of Onset  Cancer Mother    Congestive Heart Failure Father    Diabetes Maternal Grandmother     Social History Social History   Tobacco Use   Smoking status: Former   Smokeless tobacco: Never  Scientific laboratory technician Use: Never used  Substance Use Topics   Alcohol use: No   Drug use: No     Allergies   Aspirin and Pregabalin   Review of Systems Review of Systems see HPI   Physical Exam Triage Vital Signs ED Triage Vitals  Enc Vitals Group     BP 09/09/21 0909 140/81     Pulse Rate 09/09/21 0909 83     Resp 09/09/21 0909 18     Temp 09/09/21 0909 98.7 F (37.1 C)      Temp Source 09/09/21 0909 Oral     SpO2 09/09/21 0909 98 %     Weight 09/09/21 0906 169 lb (76.7 kg)     Height 09/09/21 0906 5\' 4"  (1.626 m)     Pain Score 09/09/21 0906 3     Pain Loc --    Updated Vital Signs BP 140/81 (BP Location: Left Arm)    Pulse 83    Temp 98.7 F (37.1 C) (Oral)    Resp 18    Ht 5\' 4"  (1.626 m)    Wt 76.7 kg    SpO2 98%    BMI 29.01 kg/m   Physical Exam Constitutional:      General: She is not in acute distress.    Appearance: She is not ill-appearing or toxic-appearing.     Comments: Nicely groomed, sitting on the exam table.  Moderately frequent coughing during exam but no sustained spells.  HENT:     Head: Atraumatic.     Comments: Bilateral TMs are unremarkable, minimal if any nasal congestion appreciated, throat is mildly injected, without significant postnasal drainage Right cheek and the angle of the jaw with mild swelling and tenderness, consistent with mild parotitis Eyes:     Conjunctiva/sclera:     Right eye: Right conjunctiva is not injected. No exudate.    Left eye: Left conjunctiva is not injected. No exudate.    Comments: Conjugate gaze observed  Cardiovascular:     Rate and Rhythm: Normal rate and regular rhythm.  Pulmonary:     Effort: Pulmonary effort is normal. No respiratory distress.     Breath sounds: No wheezing or rhonchi.     Comments: Mild and symmetric coarseness throughout No focal findings Abdominal:     General: There is no distension.  Musculoskeletal:     Cervical back: Neck supple.     Comments: Able to walk into the exam room without difficulty, and climb on/off the exam table  Skin:    General: Skin is warm and dry.     Comments: No cyanosis or pallor  Neurological:     Mental Status: She is alert.     Comments: Face symmetric, speech is clear, coherent, logical     UC Treatments / Results  Labs (all labs ordered are listed, but only abnormal results are displayed) Labs Reviewed - No data to  display N/A  EKG N/A  Radiology No results found. N/A  Procedures Procedures (including critical care time) N/A  Medications Ordered in UC Medications - No data to display N/A  Initial Impression / Assessment and Plan / UC Course  Recent hospitalization for UTI/diverticular disease, improved but persistent GI upset. Manage chapped bottom.  Not worsening, vitals and  history reassuring.  Observe for improvement for now. Recent covid post paxlovid tx, with persistent bothersome cough.  Not worsening.  Minimal production.  Vitals and chest exam reassuring.  Manage cough and observe for improvement for now. New right parotid swelling with pain--could be viral, stone, or dehydration/inspissation of secretions but certainly risk for secondary bacterial infection.  Trial of lemon drops/hydration but low threshold to start augmentin--discussed with patient and rx sent to pharmacy to start if not improving.  Augmentin may aggravate bowel concerns. Off orencia injection as advised by rheumatologist.     Final Clinical Impressions(s) / UC Diagnoses   Final diagnoses:  Post-viral cough syndrome  Swelling of right parotid gland  Irritation of skin of perianal region     Discharge Instructions      Several things going on today.   1-Recent (09/01/21) covid infection with persistent productive cough.  Lung exam was symmetric; mild coarseness heard throughout, suggests bronchial irritation.  I did not hear a pneumonia today.  Prescription for Albuterol inhaler sent to pharmacy.  Continue tessalon perles and might try codeine cough syrup you have at home; try this when you have someone at home to help you, in case it makes you lightheaded.  Codeine can increase the risk of falling.   2-right cheek/jaw painful swelling that started today.  Parotid gland irritation, can be viral or sometimes grit or mucus block the gland.  Push lemon drops today.  If worse tomorrow, start the prescription for  amoxicillin/clavulanate (antibiotic for infection).   3--irritation of bottom.  Try vaseline to soothe/protect area from stool.  Avoid vigorous cleanings/wipes right now--clean gently and with just water right now to let the tissues heal.  Blot dry after having bowel movements.  Can take several weeks for bowels to heal after diverticulitis and antibiotics.  Can take a couple weeks for coughing to resolve after respiratory infections. Recheck for new fever >100.5, marked increased sleepiness/lethargy, marked increase in BM frequency/explosiveness/wateriness, marked increase in phlegm production.       ED Prescriptions     Medication Sig Dispense Auth. Provider   albuterol (VENTOLIN HFA) 108 (90 Base) MCG/ACT inhaler Inhale 1-2 puffs into the lungs every 4 (four) hours as needed for wheezing or shortness of breath. 1 each Wynona Luna, MD   amoxicillin-clavulanate (AUGMENTIN) 875-125 MG tablet Take 1 tablet by mouth 2 (two) times daily for 5 days. 10 tablet Wynona Luna, MD      PDMP not reviewed this encounter.   Wynona Luna, MD 09/10/21 [phone]

## 2021-09-26 NOTE — Unmapped (Signed)
Specialty Medication(s): Orencia    Shelly Cooper has been dis-enrolled from the [facility] specialty pharmacy services due to enrollment in a manufacturer assistance program that sends medicine directly to the patient.    Additional information provided to the patient: Patient aware.    Nonie Hoyer, PharmD  PGY1 Community-based Pharmacy Resident  [facility] - Specialty Pharmacy

## 2021-09-26 NOTE — Unmapped (Signed)
COVID-19 Discontinuation of Isolation Evaluation as of October 10, 2020 [facility])    To determine when it is safe to discontinue special airborne/contact isolation prior to 20 days, review the definitions below and document that criteria have been met:     CDC DEFINITIONS:    Date of Onset: This patient's first positive documented COVID-19 test on 09/01/2021 is used as the date of onset to determine the duration of infectivity and isolation precautions within our healthcare system.Marland Kitchen  NOTE:  Admitting providers are responsible for obtaining valid information regarding COVID test results (type of test [negative antigen do not exclude COVID; obtain [facility] RT-PCR]), results, date and location of test) on patients tested at other healthcare facilities.     Illness Severity: the highest level of illness severity during their clinical course should be used when determining illness severity.   a. Mild Illness: Individuals who have any of the various signs and symptoms of COVID-19 (e.g., fever, cough, sore throat, malaise, headache, muscle pain) without shortness of breath, dyspnea, or abnormal chest imaging.  b. Moderate Illness: Individuals who have evidence of lower respiratory disease by clinical assessment or imaging, and a saturation of oxygen (SpO2) ?94% on room air.   c. Severe Illness: Individuals who have respiratory frequency >30 breaths per minute, SpO2 <94% on room air at sea level (or, for patients with chronic hypoxemia, a decrease from baseline of >3%), ratio of arterial partial pressure of oxygen to fraction of inspired oxygen (PaO2/FiO2) <300 mmHg, or lung infiltrates >50%.  d. Critical Illness: Individuals who have respiratory failure, septic shock, and/or multiple organ dysfunction.  *In pediatric patients, radiographic abnormalities are common and, for the most part, should not be used as the sole criteria to define COVID-19 illness category. Normal values for respiratory rate also vary with age in children, thus hypoxia should be the primary criterion to define severe illness, especially in younger children.    CDC Moderate to Severe Immunocompromising Conditions include but are not limited to:  ??? Active treatment for solid tumor and hematologic malignancies  ??? Receipt of solid-organ transplant and taking immunosuppressive therapy  ??? Receipt of CAR-T-cell therapy or hematopoietic cell transplant (HCT) (within 2 years of transplantation or taking immunosuppression therapy)  ??? Moderate or severe primary immunodeficiency (e.g., DiGeorge syndrome, Wiskott-Aldrich syndrome)  ??? Advanced or untreated HIV infection (people with HIV and CD4 cell counts <200/mm3, history of an AIDS-defining illness without immune reconstitution, or clinical manifestations of symptomatic HIV)  Active treatment with high-dose corticosteroids (i.e., ?20 mg prednisone or equivalent per day when administered for ?2 weeks), alkylating agents, antimetabolites, transplant-related immunosuppressive drugs, cancer chemotherapeutic agents classified as severely immunosuppressive, tumor necrosis factor (TNF) blockers, and other biologic agents that are immunosuppressive or immunomodulatory    CONFIRM ALL CRITERIA MET IN ONE OF THE FOLLOWING CATEGORIES (or continue Special Airborne/Contact Isolation):    1. Patients who had asymptomatic, mild, or moderate illness and are not severely immunocompromised:  Criteria  Met?   Patient meets CDC criteria for asymptomatic/mild/moderate illness based on provider's clinical judgement  [x]           Patient is not severely immunocompromised as defined by CDC  [x]       At least 10 days have passed since the date of the positive test. (Day of positive test day 1) [x]       At least 24 hours have passed since last fever (>=38) without fever-reducing medications*, ** (or asymptomatic) [x]       Symptoms (e.g., cough,  shortness of breath) have improved based on physician's clinical judgement* (or asymptomatic) [x]    If all the above criteria are met, check mark in each box, based on provider's clinical judgment, special airborne contact isolation can be safely discontinued for this patient population after 10 days.     *Criteria not applicable for asymptomatic patients.  **COVID patients may be on fever reducing medications for other reasons or have other reasons for fever. These patients may be removed from isolation per provider's clinical judgment at 10 days.     Attestation Statement    After reviewing the patient's clinical course and definitions above, I attest this patient meets criteria for discontinuation of Special Airborne/Contact isolation precautions for COVID-19 effective 09/26/2021.  This patient may be cared for using Universal Pandemic Precautions.

## 2021-09-26 NOTE — Unmapped (Signed)
Call to patient due to COVID flag during pre-rooming. Patient tested positive on 09/01/2021. Patient has mild cough which she states was present prior to COVID, worsened with COVID, and has since improved. No other symptoms. Patient will be day 28 on 09/29/2021 which is the day of patient's upcoming dermatology appointment.

## 2021-09-29 ENCOUNTER — Ambulatory Visit: Admit: 2021-09-29 | Discharge: 2021-09-29 | Payer: MEDICARE

## 2021-09-29 DIAGNOSIS — Z85828 Personal history of other malignant neoplasm of skin: Principal | ICD-10-CM

## 2021-09-29 DIAGNOSIS — L814 Other melanin hyperpigmentation: Principal | ICD-10-CM

## 2021-09-29 DIAGNOSIS — L570 Actinic keratosis: Principal | ICD-10-CM

## 2021-09-29 DIAGNOSIS — L821 Other seborrheic keratosis: Principal | ICD-10-CM

## 2021-09-29 DIAGNOSIS — L578 Other skin changes due to chronic exposure to nonionizing radiation: Principal | ICD-10-CM

## 2021-09-30 DIAGNOSIS — R053 Persistent cough for 3 weeks or longer: Principal | ICD-10-CM

## 2021-10-01 ENCOUNTER — Ambulatory Visit: Admit: 2021-10-01 | Discharge: 2021-10-01 | Payer: MEDICARE

## 2021-10-01 DIAGNOSIS — Z79899 Other long term (current) drug therapy: Principal | ICD-10-CM

## 2021-10-01 DIAGNOSIS — M0600 Rheumatoid arthritis without rheumatoid factor, unspecified site: Principal | ICD-10-CM

## 2021-10-01 DIAGNOSIS — M810 Age-related osteoporosis without current pathological fracture: Principal | ICD-10-CM

## 2021-10-02 ENCOUNTER — Ambulatory Visit: Admit: 2021-10-02 | Discharge: 2021-10-03 | Payer: MEDICARE

## 2021-10-06 ENCOUNTER — Ambulatory Visit: Admit: 2021-10-06 | Discharge: 2021-10-07 | Payer: MEDICARE

## 2021-12-16 ENCOUNTER — Ambulatory Visit: Payer: MEDICARE

## 2021-12-16 ENCOUNTER — Emergency Department: Payer: MEDICARE

## 2021-12-16 DIAGNOSIS — R059 Cough, unspecified type: Principal | ICD-10-CM

## 2021-12-16 DIAGNOSIS — N12 Tubulo-interstitial nephritis, not specified as acute or chronic: Principal | ICD-10-CM

## 2021-12-16 DIAGNOSIS — R197 Diarrhea, unspecified: Principal | ICD-10-CM

## 2021-12-16 DIAGNOSIS — R112 Nausea with vomiting, unspecified: Principal | ICD-10-CM

## 2021-12-16 DIAGNOSIS — R1013 Epigastric pain: Principal | ICD-10-CM

## 2021-12-16 MED ORDER — CEFDINIR 300 MG CAPSULE
ORAL_CAPSULE | Freq: Two times a day (BID) | ORAL | 0 refills | 10 days | Status: CP
Start: 2021-12-16 — End: 2021-12-26

## 2021-12-16 MED ORDER — ONDANSETRON 4 MG DISINTEGRATING TABLET
ORAL_TABLET | Freq: Three times a day (TID) | ORAL | 0 refills | 5 days | Status: CP | PRN
Start: 2021-12-16 — End: 2021-12-23

## 2021-12-18 ENCOUNTER — Ambulatory Visit
Admission: RE | Admit: 2021-12-18 | Discharge: 2021-12-18 | Disposition: A | Discharge status: Home / Self Care | Payer: Medicare Other | Source: Ambulatory Visit | Attending: Family Medicine | Admitting: Family Medicine

## 2021-12-18 ENCOUNTER — Other Ambulatory Visit: Payer: Self-pay | Admitting: Family Medicine

## 2021-12-18 DIAGNOSIS — M79662 Pain in left lower leg: Secondary | ICD-10-CM

## 2022-01-05 ENCOUNTER — Ambulatory Visit: Admit: 2022-01-05 | Discharge: 2022-01-06 | Payer: MEDICARE

## 2022-01-05 DIAGNOSIS — L814 Other melanin hyperpigmentation: Principal | ICD-10-CM

## 2022-01-05 DIAGNOSIS — D492 Neoplasm of unspecified behavior of bone, soft tissue, and skin: Principal | ICD-10-CM

## 2022-01-05 DIAGNOSIS — L304 Erythema intertrigo: Principal | ICD-10-CM

## 2022-01-05 DIAGNOSIS — Z85828 Personal history of other malignant neoplasm of skin: Principal | ICD-10-CM

## 2022-01-05 DIAGNOSIS — L821 Other seborrheic keratosis: Principal | ICD-10-CM

## 2022-01-05 DIAGNOSIS — L578 Other skin changes due to chronic exposure to nonionizing radiation: Principal | ICD-10-CM

## 2022-01-05 DIAGNOSIS — L570 Actinic keratosis: Principal | ICD-10-CM

## 2022-01-05 MED ORDER — NYSTATIN 100,000 UNIT/GRAM TOPICAL POWDER
Freq: Four times a day (QID) | TOPICAL | 11 refills | 30 days | Status: CP
Start: 2022-01-05 — End: 2023-01-05

## 2022-01-05 MED ORDER — TRIAMCINOLONE ACETONIDE 0.1 % TOPICAL OINTMENT
Freq: Two times a day (BID) | TOPICAL | 1 refills | 0.00000 days | Status: CP
Start: 2022-01-05 — End: 2023-01-05

## 2022-03-13 ENCOUNTER — Ambulatory Visit: Admit: 2022-03-13 | Discharge: 2022-03-14 | Payer: MEDICARE

## 2022-03-13 DIAGNOSIS — M7062 Trochanteric bursitis, left hip: Principal | ICD-10-CM

## 2022-03-13 DIAGNOSIS — M159 Polyosteoarthritis, unspecified: Principal | ICD-10-CM

## 2022-03-13 DIAGNOSIS — M0600 Rheumatoid arthritis without rheumatoid factor, unspecified site: Principal | ICD-10-CM

## 2022-03-13 DIAGNOSIS — M7061 Trochanteric bursitis, right hip: Principal | ICD-10-CM

## 2022-03-13 MED ORDER — DICLOFENAC 1 % TOPICAL GEL
Freq: Four times a day (QID) | TOPICAL | 3 refills | 19 days | Status: CP
Start: 2022-03-13 — End: ?

## 2022-04-13 ENCOUNTER — Ambulatory Visit
Admit: 2022-04-13 | Discharge: 2022-04-14 | Payer: MEDICARE | Attending: Student in an Organized Health Care Education/Training Program | Primary: Student in an Organized Health Care Education/Training Program

## 2022-04-13 DIAGNOSIS — L814 Other melanin hyperpigmentation: Principal | ICD-10-CM

## 2022-04-13 DIAGNOSIS — L821 Other seborrheic keratosis: Principal | ICD-10-CM

## 2022-04-13 DIAGNOSIS — L570 Actinic keratosis: Principal | ICD-10-CM

## 2022-04-13 DIAGNOSIS — Z85828 Personal history of other malignant neoplasm of skin: Principal | ICD-10-CM

## 2022-04-13 DIAGNOSIS — D492 Neoplasm of unspecified behavior of bone, soft tissue, and skin: Principal | ICD-10-CM

## 2022-05-05 DIAGNOSIS — Z1231 Encounter for screening mammogram for malignant neoplasm of breast: Principal | ICD-10-CM

## 2022-05-15 ENCOUNTER — Encounter (INDEPENDENT_AMBULATORY_CARE_PROVIDER_SITE_OTHER): Payer: Medicare Other | Admitting: Ophthalmology

## 2022-05-15 DIAGNOSIS — H35033 Hypertensive retinopathy, bilateral: Secondary | ICD-10-CM | POA: Diagnosis not present

## 2022-05-15 DIAGNOSIS — I10 Essential (primary) hypertension: Secondary | ICD-10-CM

## 2022-05-15 DIAGNOSIS — H353132 Nonexudative age-related macular degeneration, bilateral, intermediate dry stage: Secondary | ICD-10-CM | POA: Diagnosis not present

## 2022-05-15 DIAGNOSIS — H43813 Vitreous degeneration, bilateral: Secondary | ICD-10-CM

## 2022-05-26 ENCOUNTER — Ambulatory Visit: Admit: 2022-05-26 | Discharge: 2022-05-27 | Payer: MEDICARE

## 2022-07-03 DIAGNOSIS — M0600 Rheumatoid arthritis without rheumatoid factor, unspecified site: Principal | ICD-10-CM

## 2022-07-03 MED ORDER — ORENCIA CLICKJECT 125 MG/ML SUBCUTANEOUS AUTO-INJECTOR
SUBCUTANEOUS | 3 refills | 84 days | Status: CP
Start: 2022-07-03 — End: ?

## 2022-07-06 DIAGNOSIS — M0600 Rheumatoid arthritis without rheumatoid factor, unspecified site: Principal | ICD-10-CM

## 2022-07-27 ENCOUNTER — Ambulatory Visit
Admit: 2022-07-27 | Discharge: 2022-07-28 | Payer: MEDICARE | Attending: Student in an Organized Health Care Education/Training Program | Primary: Student in an Organized Health Care Education/Training Program

## 2022-07-27 DIAGNOSIS — Z85828 Personal history of other malignant neoplasm of skin: Principal | ICD-10-CM

## 2022-07-27 DIAGNOSIS — L821 Other seborrheic keratosis: Principal | ICD-10-CM

## 2022-07-27 DIAGNOSIS — L570 Actinic keratosis: Principal | ICD-10-CM

## 2022-07-27 DIAGNOSIS — L578 Other skin changes due to chronic exposure to nonionizing radiation: Principal | ICD-10-CM

## 2022-07-27 DIAGNOSIS — L814 Other melanin hyperpigmentation: Principal | ICD-10-CM

## 2022-07-27 DIAGNOSIS — D1801 Hemangioma of skin and subcutaneous tissue: Principal | ICD-10-CM

## 2022-07-27 MED ORDER — FLUOROURACIL 5 % TOPICAL CREAM
6 refills | 0.00000 days | Status: CP
Start: 2022-07-27 — End: ?

## 2022-08-05 ENCOUNTER — Ambulatory Visit: Admit: 2022-08-05 | Discharge: 2022-08-06 | Payer: MEDICARE

## 2022-08-05 DIAGNOSIS — M0600 Rheumatoid arthritis without rheumatoid factor, unspecified site: Principal | ICD-10-CM

## 2022-08-05 DIAGNOSIS — Z79899 Other long term (current) drug therapy: Principal | ICD-10-CM

## 2022-08-13 ENCOUNTER — Other Ambulatory Visit: Payer: Self-pay | Admitting: Gerontology

## 2022-08-13 DIAGNOSIS — J189 Pneumonia, unspecified organism: Secondary | ICD-10-CM

## 2022-08-13 DIAGNOSIS — R053 Chronic cough: Secondary | ICD-10-CM

## 2022-08-14 ENCOUNTER — Ambulatory Visit
Admission: RE | Admit: 2022-08-14 | Discharge: 2022-08-14 | Disposition: A | Discharge status: Home / Self Care | Payer: Medicare Other | Source: Ambulatory Visit | Attending: Gerontology | Admitting: Gerontology

## 2022-08-14 DIAGNOSIS — R053 Chronic cough: Secondary | ICD-10-CM | POA: Insufficient documentation

## 2022-08-14 DIAGNOSIS — J189 Pneumonia, unspecified organism: Secondary | ICD-10-CM | POA: Insufficient documentation

## 2022-08-14 MED ORDER — IOHEXOL 300 MG/ML  SOLN
100.0000 mL | Freq: Once | INTRAMUSCULAR | Status: AC | PRN
  Administered 2022-08-14: 100 mL via INTRAVENOUS

## 2022-08-18 ENCOUNTER — Emergency Department: Admit: 2022-08-18 | Discharge: 2022-08-18 | Disposition: A | Discharge status: Home / Self Care | Payer: MEDICARE

## 2022-08-18 ENCOUNTER — Ambulatory Visit: Admit: 2022-08-18 | Discharge: 2022-08-18 | Disposition: A | Discharge status: Home / Self Care | Payer: MEDICARE

## 2022-08-18 MED ORDER — CODEINE 10 MG-GUAIFENESIN 100 MG/5 ML ORAL LIQUID
Freq: Three times a day (TID) | ORAL | 0 refills | 5 days | Status: CP | PRN
Start: 2022-08-18 — End: 2022-08-23

## 2022-08-20 DIAGNOSIS — I272 Pulmonary hypertension, unspecified: Principal | ICD-10-CM

## 2023-01-04 ENCOUNTER — Ambulatory Visit
Admit: 2023-01-04 | Discharge: 2023-01-05 | Payer: MEDICARE | Attending: Student in an Organized Health Care Education/Training Program | Primary: Student in an Organized Health Care Education/Training Program

## 2023-01-04 DIAGNOSIS — D229 Melanocytic nevi, unspecified: Principal | ICD-10-CM

## 2023-01-04 DIAGNOSIS — L814 Other melanin hyperpigmentation: Principal | ICD-10-CM

## 2023-01-04 DIAGNOSIS — Z85828 Personal history of other malignant neoplasm of skin: Principal | ICD-10-CM

## 2023-01-04 DIAGNOSIS — L821 Other seborrheic keratosis: Principal | ICD-10-CM

## 2023-01-04 DIAGNOSIS — L570 Actinic keratosis: Principal | ICD-10-CM

## 2023-01-04 DIAGNOSIS — D1801 Hemangioma of skin and subcutaneous tissue: Principal | ICD-10-CM

## 2023-01-04 MED ORDER — FLUOROURACIL 5 % TOPICAL CREAM
4 refills | 0.00000 days | Status: CP
Start: 2023-01-04 — End: ?

## 2023-01-06 ENCOUNTER — Ambulatory Visit: Admit: 2023-01-06 | Discharge: 2023-01-07 | Payer: MEDICARE

## 2023-01-06 DIAGNOSIS — M0600 Rheumatoid arthritis without rheumatoid factor, unspecified site: Principal | ICD-10-CM

## 2023-05-18 ENCOUNTER — Encounter (INDEPENDENT_AMBULATORY_CARE_PROVIDER_SITE_OTHER): Payer: Medicare Other | Admitting: Ophthalmology

## 2023-05-18 DIAGNOSIS — H43813 Vitreous degeneration, bilateral: Secondary | ICD-10-CM | POA: Diagnosis not present

## 2023-05-18 DIAGNOSIS — H353132 Nonexudative age-related macular degeneration, bilateral, intermediate dry stage: Secondary | ICD-10-CM | POA: Diagnosis not present

## 2023-05-18 DIAGNOSIS — H35033 Hypertensive retinopathy, bilateral: Secondary | ICD-10-CM

## 2023-05-18 DIAGNOSIS — I10 Essential (primary) hypertension: Secondary | ICD-10-CM

## 2023-05-19 ENCOUNTER — Encounter (INDEPENDENT_AMBULATORY_CARE_PROVIDER_SITE_OTHER): Payer: Medicare Other | Admitting: Ophthalmology

## 2023-06-09 ENCOUNTER — Ambulatory Visit: Admit: 2023-06-09 | Discharge: 2023-06-10 | Payer: MEDICARE

## 2023-06-09 DIAGNOSIS — Z79899 Other long term (current) drug therapy: Principal | ICD-10-CM

## 2023-06-09 DIAGNOSIS — M0600 Rheumatoid arthritis without rheumatoid factor, unspecified site: Principal | ICD-10-CM

## 2023-06-09 DIAGNOSIS — M7062 Trochanteric bursitis, left hip: Principal | ICD-10-CM

## 2023-06-09 DIAGNOSIS — M7061 Trochanteric bursitis, right hip: Principal | ICD-10-CM

## 2023-06-09 DIAGNOSIS — M150 Primary generalized (osteo)arthritis: Principal | ICD-10-CM

## 2023-06-09 MED ORDER — ORENCIA CLICKJECT 125 MG/ML SUBCUTANEOUS AUTO-INJECTOR
SUBCUTANEOUS | 3 refills | 84 days | Status: CP
Start: 2023-06-09 — End: ?

## 2023-06-09 MED ORDER — DICLOFENAC 1 % TOPICAL GEL
Freq: Four times a day (QID) | TOPICAL | 3 refills | 19 days | Status: CP
Start: 2023-06-09 — End: ?

## 2023-06-10 DIAGNOSIS — M0600 Rheumatoid arthritis without rheumatoid factor, unspecified site: Principal | ICD-10-CM

## 2023-06-28 ENCOUNTER — Ambulatory Visit
Admit: 2023-06-28 | Discharge: 2023-06-29 | Payer: MEDICARE | Attending: Student in an Organized Health Care Education/Training Program | Primary: Student in an Organized Health Care Education/Training Program

## 2023-06-28 DIAGNOSIS — D229 Melanocytic nevi, unspecified: Principal | ICD-10-CM

## 2023-06-28 DIAGNOSIS — L578 Other skin changes due to chronic exposure to nonionizing radiation: Principal | ICD-10-CM

## 2023-06-28 DIAGNOSIS — L814 Other melanin hyperpigmentation: Principal | ICD-10-CM

## 2023-06-28 DIAGNOSIS — D1801 Hemangioma of skin and subcutaneous tissue: Principal | ICD-10-CM

## 2023-06-28 DIAGNOSIS — Z85828 Personal history of other malignant neoplasm of skin: Principal | ICD-10-CM

## 2023-06-28 DIAGNOSIS — L570 Actinic keratosis: Principal | ICD-10-CM

## 2023-06-28 DIAGNOSIS — L821 Other seborrheic keratosis: Principal | ICD-10-CM

## 2023-08-02 ENCOUNTER — Ambulatory Visit
Admit: 2023-08-02 | Discharge: 2023-08-03 | Payer: MEDICARE | Attending: Student in an Organized Health Care Education/Training Program | Primary: Student in an Organized Health Care Education/Training Program

## 2023-08-02 DIAGNOSIS — L570 Actinic keratosis: Principal | ICD-10-CM

## 2023-08-02 DIAGNOSIS — Z85828 Personal history of other malignant neoplasm of skin: Principal | ICD-10-CM

## 2023-08-02 DIAGNOSIS — D492 Neoplasm of unspecified behavior of bone, soft tissue, and skin: Principal | ICD-10-CM

## 2023-08-02 DIAGNOSIS — L578 Other skin changes due to chronic exposure to nonionizing radiation: Principal | ICD-10-CM

## 2023-08-02 MED ORDER — FLUOROURACIL 5 % TOPICAL CREAM
4 refills | 0 days | Status: CP
Start: 2023-08-02 — End: ?

## 2023-09-29 ENCOUNTER — Emergency Department: Admit: 2023-09-29 | Discharge: 2023-09-29 | Disposition: A | Discharge status: Home / Self Care | Payer: MEDICARE

## 2023-09-29 DIAGNOSIS — N390 Urinary tract infection, site not specified: Principal | ICD-10-CM

## 2023-09-29 DIAGNOSIS — I10 Essential (primary) hypertension: Principal | ICD-10-CM

## 2023-09-29 MED ORDER — CEPHALEXIN 500 MG CAPSULE
ORAL_CAPSULE | Freq: Four times a day (QID) | ORAL | 0 refills | 10.00 days | Status: CP
Start: 2023-09-29 — End: 2023-10-09

## 2023-10-04 ENCOUNTER — Ambulatory Visit
Admit: 2023-10-04 | Discharge: 2023-10-05 | Payer: MEDICARE | Attending: Student in an Organized Health Care Education/Training Program | Primary: Student in an Organized Health Care Education/Training Program

## 2023-10-04 DIAGNOSIS — L578 Other skin changes due to chronic exposure to nonionizing radiation: Principal | ICD-10-CM

## 2023-10-04 DIAGNOSIS — Z85828 Personal history of other malignant neoplasm of skin: Principal | ICD-10-CM

## 2023-10-04 DIAGNOSIS — L570 Actinic keratosis: Principal | ICD-10-CM

## 2023-10-04 MED ORDER — FLUOROURACIL 5 % TOPICAL CREAM
4 refills | 0.00 days | Status: CP
Start: 2023-10-04 — End: ?

## 2023-11-09 ENCOUNTER — Ambulatory Visit: Admit: 2023-11-09 | Discharge: 2023-11-10 | Payer: MEDICARE

## 2023-11-09 DIAGNOSIS — M150 Primary generalized (osteo)arthritis: Principal | ICD-10-CM

## 2023-11-09 DIAGNOSIS — Z79631 Methotrexate, long term, current use: Principal | ICD-10-CM

## 2023-11-09 DIAGNOSIS — M5432 Sciatica, left side: Principal | ICD-10-CM

## 2023-11-09 DIAGNOSIS — M0600 Rheumatoid arthritis without rheumatoid factor, unspecified site: Principal | ICD-10-CM

## 2023-11-09 MED ORDER — DICLOFENAC 1 % TOPICAL GEL
Freq: Four times a day (QID) | TOPICAL | 3 refills | 19.00 days | Status: CP
Start: 2023-11-09 — End: ?

## 2023-11-09 MED ORDER — METHOTREXATE SODIUM 2.5 MG TABLET
ORAL_TABLET | ORAL | 3 refills | 84.00 days | Status: CP
Start: 2023-11-09 — End: ?

## 2023-11-09 MED ORDER — FOLIC ACID 1 MG TABLET
ORAL_TABLET | Freq: Every day | ORAL | 3 refills | 90.00 days | Status: CP
Start: 2023-11-09 — End: 2024-11-08

## 2024-01-17 ENCOUNTER — Ambulatory Visit
Admit: 2024-01-17 | Discharge: 2024-01-18 | Payer: Medicare (Managed Care) | Attending: Student in an Organized Health Care Education/Training Program | Primary: Student in an Organized Health Care Education/Training Program

## 2024-01-17 DIAGNOSIS — Z85828 Personal history of other malignant neoplasm of skin: Principal | ICD-10-CM

## 2024-01-17 DIAGNOSIS — L570 Actinic keratosis: Principal | ICD-10-CM

## 2024-01-17 DIAGNOSIS — L578 Other skin changes due to chronic exposure to nonionizing radiation: Principal | ICD-10-CM

## 2024-01-17 MED ORDER — FLUOROURACIL 5 % TOPICAL CREAM
TOPICAL | 4 refills | 0.00000 days | Status: CP
Start: 2024-01-17 — End: ?

## 2024-04-24 DIAGNOSIS — D229 Melanocytic nevi, unspecified: Principal | ICD-10-CM

## 2024-04-24 DIAGNOSIS — L821 Other seborrheic keratosis: Principal | ICD-10-CM

## 2024-04-24 DIAGNOSIS — D1801 Hemangioma of skin and subcutaneous tissue: Principal | ICD-10-CM

## 2024-04-24 DIAGNOSIS — L570 Actinic keratosis: Principal | ICD-10-CM

## 2024-04-24 DIAGNOSIS — Z85828 Personal history of other malignant neoplasm of skin: Principal | ICD-10-CM

## 2024-04-24 DIAGNOSIS — D489 Neoplasm of uncertain behavior, unspecified: Principal | ICD-10-CM

## 2024-04-24 DIAGNOSIS — L814 Other melanin hyperpigmentation: Principal | ICD-10-CM

## 2024-05-03 ENCOUNTER — Ambulatory Visit: Admit: 2024-05-03 | Discharge: 2024-05-04 | Payer: Medicare (Managed Care)

## 2024-05-03 ENCOUNTER — Other Ambulatory Visit: Payer: Self-pay | Admitting: Pulmonary Disease

## 2024-05-03 DIAGNOSIS — M0600 Rheumatoid arthritis without rheumatoid factor, unspecified site: Principal | ICD-10-CM

## 2024-05-03 DIAGNOSIS — Z79899 Other long term (current) drug therapy: Principal | ICD-10-CM

## 2024-05-03 DIAGNOSIS — J849 Interstitial pulmonary disease, unspecified: Secondary | ICD-10-CM

## 2024-05-03 DIAGNOSIS — R058 Other specified cough: Secondary | ICD-10-CM

## 2024-05-19 ENCOUNTER — Encounter (INDEPENDENT_AMBULATORY_CARE_PROVIDER_SITE_OTHER): Payer: Medicare Other | Admitting: Ophthalmology

## 2024-05-19 DIAGNOSIS — I10 Essential (primary) hypertension: Secondary | ICD-10-CM

## 2024-05-19 DIAGNOSIS — H353132 Nonexudative age-related macular degeneration, bilateral, intermediate dry stage: Secondary | ICD-10-CM

## 2024-05-19 DIAGNOSIS — H43813 Vitreous degeneration, bilateral: Secondary | ICD-10-CM

## 2024-05-19 DIAGNOSIS — H35033 Hypertensive retinopathy, bilateral: Secondary | ICD-10-CM

## 2024-05-23 ENCOUNTER — Ambulatory Visit
Admission: RE | Admit: 2024-05-23 | Discharge: 2024-05-23 | Disposition: A | Discharge status: Home / Self Care | Source: Ambulatory Visit | Attending: Pulmonary Disease | Admitting: Pulmonary Disease

## 2024-05-23 DIAGNOSIS — J849 Interstitial pulmonary disease, unspecified: Secondary | ICD-10-CM | POA: Insufficient documentation

## 2024-05-23 DIAGNOSIS — R058 Other specified cough: Secondary | ICD-10-CM | POA: Diagnosis present

## 2024-05-31 ENCOUNTER — Other Ambulatory Visit: Payer: Self-pay | Admitting: Pulmonary Disease

## 2024-05-31 DIAGNOSIS — R10A2 Flank pain, left side: Secondary | ICD-10-CM

## 2024-06-01 ENCOUNTER — Ambulatory Visit
Admission: RE | Admit: 2024-06-01 | Discharge: 2024-06-01 | Disposition: A | Discharge status: Home / Self Care | Source: Ambulatory Visit | Attending: Pulmonary Disease | Admitting: Pulmonary Disease

## 2024-06-01 DIAGNOSIS — R10A2 Flank pain, left side: Secondary | ICD-10-CM | POA: Diagnosis present

## 2024-06-19 DIAGNOSIS — L578 Other skin changes due to chronic exposure to nonionizing radiation: Principal | ICD-10-CM

## 2024-06-19 DIAGNOSIS — Z85828 Personal history of other malignant neoplasm of skin: Principal | ICD-10-CM

## 2024-06-19 DIAGNOSIS — C44712 Basal cell carcinoma of skin of right lower limb, including hip: Principal | ICD-10-CM

## 2024-06-19 DIAGNOSIS — T148XXA Other injury of unspecified body region, initial encounter: Principal | ICD-10-CM

## 2024-06-19 DIAGNOSIS — D489 Neoplasm of uncertain behavior, unspecified: Principal | ICD-10-CM

## 2024-06-19 DIAGNOSIS — L570 Actinic keratosis: Principal | ICD-10-CM

## 2024-06-19 DIAGNOSIS — C44519 Basal cell carcinoma of skin of other part of trunk: Principal | ICD-10-CM

## 2024-06-19 MED ORDER — MUPIROCIN 2 % TOPICAL OINTMENT
Freq: Three times a day (TID) | TOPICAL | 1 refills | 0.00000 days | Status: CP
Start: 2024-06-19 — End: ?

## 2024-07-17 DIAGNOSIS — L282 Other prurigo: Principal | ICD-10-CM

## 2024-07-17 DIAGNOSIS — L570 Actinic keratosis: Principal | ICD-10-CM

## 2024-07-17 DIAGNOSIS — Z85828 Personal history of other malignant neoplasm of skin: Principal | ICD-10-CM

## 2024-07-17 DIAGNOSIS — D489 Neoplasm of uncertain behavior, unspecified: Principal | ICD-10-CM

## 2024-07-17 DIAGNOSIS — L309 Dermatitis, unspecified: Principal | ICD-10-CM

## 2024-07-17 MED ORDER — TRIAMCINOLONE ACETONIDE 0.1 % TOPICAL OINTMENT
INTRAMUSCULAR | 5 refills | 0.00000 days | Status: CP
Start: 2024-07-17 — End: ?

## 2024-07-17 MED ORDER — FLUOROURACIL 5 % TOPICAL CREAM
TOPICAL | 1 refills | 0.00000 days | Status: CP
Start: 2024-07-17 — End: ?

## 2024-08-04 ENCOUNTER — Ambulatory Visit: Admit: 2024-08-04 | Discharge: 2024-08-05 | Payer: Medicare (Managed Care) | Attending: Medical | Primary: Medical

## 2024-08-04 DIAGNOSIS — M0600 Rheumatoid arthritis without rheumatoid factor, unspecified site: Principal | ICD-10-CM

## 2025-05-21 ENCOUNTER — Encounter (INDEPENDENT_AMBULATORY_CARE_PROVIDER_SITE_OTHER): Admitting: Ophthalmology
